# Patient Record
Sex: Female | Born: 1975 | Race: White | Hispanic: No | Marital: Married | Smoking: Former smoker
Health system: Southern US, Community
[De-identification: ages and names within clinical notes are randomized; demographics above are authoritative.]

## PROBLEM LIST (undated history)

## (undated) DIAGNOSIS — I499 Cardiac arrhythmia, unspecified: Secondary | ICD-10-CM

## (undated) DIAGNOSIS — M1990 Unspecified osteoarthritis, unspecified site: Secondary | ICD-10-CM

## (undated) DIAGNOSIS — I10 Essential (primary) hypertension: Secondary | ICD-10-CM

## (undated) DIAGNOSIS — J302 Other seasonal allergic rhinitis: Secondary | ICD-10-CM

## (undated) HISTORY — DX: Essential (primary) hypertension: I10

---

## 2005-09-14 ENCOUNTER — Observation Stay: Payer: Self-pay

## 2005-09-15 ENCOUNTER — Inpatient Hospital Stay: Payer: Self-pay

## 2010-01-18 HISTORY — PX: BREAST SURGERY: SHX581

## 2011-01-19 HISTORY — PX: AUGMENTATION MAMMAPLASTY: SUR837

## 2016-04-05 ENCOUNTER — Telehealth: Payer: Self-pay | Admitting: Obstetrics & Gynecology

## 2016-04-05 NOTE — Telephone Encounter (Signed)
[facility] I would send in the refill for you but I don't know how.

## 2016-04-05 NOTE — Telephone Encounter (Signed)
bc refilled enough until annual, faxed to pharmacy

## 2016-04-05 NOTE — Telephone Encounter (Signed)
Patient is a former [facility] pt.  She called and scheduled her annual for May 8th with [facility].  She needs a refill on her bc to last until then.  She stated that she should have started her pills yesterday.  Please advise pt. At [phone]

## 2016-05-10 ENCOUNTER — Ambulatory Visit (INDEPENDENT_AMBULATORY_CARE_PROVIDER_SITE_OTHER): Payer: BLUE CROSS/BLUE SHIELD | Admitting: Family Medicine

## 2016-05-10 ENCOUNTER — Encounter: Payer: Self-pay | Admitting: Family Medicine

## 2016-05-10 VITALS — BP 142/90 | HR 80 | Ht 63.0 in | Wt 158.0 lb

## 2016-05-10 DIAGNOSIS — R030 Elevated blood-pressure reading, without diagnosis of hypertension: Secondary | ICD-10-CM | POA: Diagnosis not present

## 2016-05-10 DIAGNOSIS — I493 Ventricular premature depolarization: Secondary | ICD-10-CM

## 2016-05-10 MED ORDER — HYDROCHLOROTHIAZIDE 12.5 MG PO TABS: 12.5000 mg | ORAL_TABLET | Freq: Every day | ORAL | 3 refills | Status: DC

## 2016-05-10 NOTE — Progress Notes (Signed)
Name: Joanna Huffman   MRN: [id number]    DOB: 1975-07-11   Date:05/10/2016       Progress Note  Subjective  Chief Complaint  Chief Complaint  Patient presents with  . Establish Care  . Hypertension    diastolic readings are elevated    Hypertension  This is a new problem. The current episode started more than 1 month ago. The problem has been waxing and waning since onset. The problem is uncontrolled. Pertinent negatives include no anxiety, blurred vision, chest pain, headaches, malaise/fatigue, neck pain, orthopnea, palpitations, peripheral edema, PND, shortness of breath or sweats. There are no associated agents to hypertension. There are no known risk factors for coronary artery disease. Past treatments include nothing. There are no compliance problems.  There is no history of angina, kidney disease, CAD/MI, CVA, heart failure, left ventricular hypertrophy, PVD or retinopathy. conduction problem. There is no history of chronic renal disease, coarctation of the aorta, a hypertension causing med, pheochromocytoma or renovascular disease.    No problem-specific Assessment & Plan notes found for this encounter.   History reviewed. No pertinent past medical history.  Past Surgical History:  Procedure Laterality Date  . BREAST SURGERY  2012   augmentation    Family History  Problem Relation Age of Onset  . Hypertension Mother   . Hypertension Father   . Diabetes Father   . Heart disease Paternal Grandmother     Social History   Social History  . Marital status: Married    Spouse name: N/A  . Number of children: N/A  . Years of education: N/A   Occupational History  . Not on file.   Social History Main Topics  . Smoking status: Former Smoker    Types: Cigarettes  . Smokeless tobacco: Never Used  . Alcohol use Yes  . Drug use: No  . Sexual activity: Yes   Other Topics Concern  . Not on file   Social History Narrative  . No narrative on file    No Known  Allergies  No outpatient prescriptions prior to visit.   No facility-administered medications prior to visit.     Review of Systems  Constitutional: Negative for chills, fever, malaise/fatigue and weight loss.  HENT: Negative for ear discharge, ear pain and sore throat.   Eyes: Negative for blurred vision.  Respiratory: Negative for cough, sputum production, shortness of breath and wheezing.   Cardiovascular: Negative for chest pain, palpitations, orthopnea, leg swelling and PND.  Gastrointestinal: Negative for abdominal pain, blood in stool, constipation, diarrhea, heartburn, melena and nausea.  Genitourinary: Negative for dysuria, frequency, hematuria and urgency.  Musculoskeletal: Negative for back pain, joint pain, myalgias and neck pain.  Skin: Negative for rash.  Neurological: Negative for dizziness, tingling, sensory change, focal weakness and headaches.  Endo/Heme/Allergies: Negative for environmental allergies and polydipsia. Does not bruise/bleed easily.  Psychiatric/Behavioral: Negative for depression and suicidal ideas. The patient is not nervous/anxious and does not have insomnia.      Objective  Vitals:   05/10/16 1432  BP: (!) 142/90  Pulse: 80  Weight: 158 lb (71.7 kg)  Height:  (1.6 m)    Physical Exam  Constitutional: She is well-developed, well-nourished, and in no distress. No distress.  HENT:  Head: Normocephalic and atraumatic.  Right Ear: External ear normal.  Left Ear: External ear normal.  Nose: Nose normal.  Mouth/Throat: Oropharynx is clear and moist.  Eyes: Conjunctivae and EOM are normal. Pupils are equal, round, and reactive  to light. Right eye exhibits no discharge. Left eye exhibits no discharge.  Neck: Normal range of motion. Neck supple. No JVD present. No thyromegaly present.  Cardiovascular: Normal rate, regular rhythm, normal heart sounds and intact distal pulses.  Exam reveals no gallop and no friction rub.   No murmur  heard. Pulmonary/Chest: Effort normal and breath sounds normal. She has no wheezes. She has no rales.  Abdominal: Soft. Bowel sounds are normal. She exhibits no mass. There is no tenderness. There is no guarding.  Musculoskeletal: Normal range of motion. She exhibits no edema.  Lymphadenopathy:    She has no cervical adenopathy.  Neurological: She is alert. She has normal reflexes.  Skin: Skin is warm and dry. She is not diaphoretic.  Psychiatric: Mood and affect normal.  Nursing note and vitals reviewed.     Assessment & Plan  Problem List Items Addressed This Visit      Cardiovascular and Mediastinum   Asymptomatic PVCs   Relevant Medications   hydrochlorothiazide (HYDRODIURIL) 12.5 MG tablet     Other   Blood pressure elevated without history of HTN - Primary   Relevant Medications   hydrochlorothiazide (HYDRODIURIL) 12.5 MG tablet      Meds ordered this encounter  Medications  . hydrochlorothiazide (HYDRODIURIL) 12.5 MG tablet    Sig: Take 1 tablet (12.5 mg total) by mouth daily.    Dispense:  90 tablet    Refill:  3      Dr. Elizabeth Sauer [facility]  05/10/16

## 2016-05-25 ENCOUNTER — Ambulatory Visit: Payer: Self-pay | Admitting: Obstetrics & Gynecology

## 2016-06-21 ENCOUNTER — Ambulatory Visit (INDEPENDENT_AMBULATORY_CARE_PROVIDER_SITE_OTHER): Payer: BLUE CROSS/BLUE SHIELD | Admitting: Family Medicine

## 2016-06-21 ENCOUNTER — Encounter: Payer: Self-pay | Admitting: Family Medicine

## 2016-06-21 VITALS — BP 134/86 | HR 62 | Ht 63.0 in | Wt 168.0 lb

## 2016-06-21 DIAGNOSIS — R030 Elevated blood-pressure reading, without diagnosis of hypertension: Secondary | ICD-10-CM

## 2016-06-21 MED ORDER — HYDROCHLOROTHIAZIDE 12.5 MG PO TABS: 12.5000 mg | ORAL_TABLET | Freq: Every day | ORAL | 2 refills | Status: DC

## 2016-06-21 NOTE — Progress Notes (Signed)
Name: Joanna Huffman   MRN: [id number]    DOB: 07-23-1975   Date:06/21/2016       Progress Note  Subjective  Chief Complaint  Chief Complaint  Patient presents with  . Hypertension    follow up - 6 weeks    Hypertension  This is a new problem. The current episode started more than 1 month ago. The problem has been gradually improving since onset. The problem is controlled. Pertinent negatives include no anxiety, blurred vision, chest pain, headaches, malaise/fatigue, neck pain, orthopnea, palpitations, peripheral edema, PND, shortness of breath or sweats. There are no associated agents to hypertension. Past treatments include diuretics. The current treatment provides moderate improvement. There are no compliance problems.  There is no history of angina, kidney disease, CAD/MI, CVA, heart failure, left ventricular hypertrophy, PVD or retinopathy. There is no history of chronic renal disease, a hypertension causing med or renovascular disease.    No problem-specific Assessment & Plan notes found for this encounter.   History reviewed. No pertinent past medical history.  Past Surgical History:  Procedure Laterality Date  . BREAST SURGERY  2012   augmentation    Family History  Problem Relation Age of Onset  . Hypertension Mother   . Hypertension Father   . Diabetes Father   . Heart disease Paternal Grandmother     Social History   Social History  . Marital status: Married    Spouse name: N/A  . Number of children: N/A  . Years of education: N/A   Occupational History  . Not on file.   Social History Main Topics  . Smoking status: Former Smoker    Types: Cigarettes  . Smokeless tobacco: Never Used  . Alcohol use Yes  . Drug use: No  . Sexual activity: Yes   Other Topics Concern  . Not on file   Social History Narrative  . No narrative on file    No Known Allergies  Outpatient Medications Prior to Visit  Medication Sig Dispense Refill  . levonorgestrel-ethinyl  estradiol (SEASONALE,INTROVALE,JOLESSA) 0.15-0.03 MG tablet Take 1 tablet by mouth daily. [facility]    . hydrochlorothiazide (HYDRODIURIL) 12.5 MG tablet Take 1 tablet (12.5 mg total) by mouth daily. 90 tablet 3   No facility-administered medications prior to visit.     Review of Systems  Constitutional: Negative for chills, fever, malaise/fatigue and weight loss.  HENT: Negative for ear discharge, ear pain and sore throat.   Eyes: Negative for blurred vision.  Respiratory: Negative for cough, sputum production, shortness of breath and wheezing.   Cardiovascular: Negative for chest pain, palpitations, orthopnea, leg swelling and PND.  Gastrointestinal: Negative for abdominal pain, blood in stool, constipation, diarrhea, heartburn, melena and nausea.  Genitourinary: Negative for dysuria, frequency, hematuria and urgency.  Musculoskeletal: Negative for back pain, joint pain, myalgias and neck pain.  Skin: Negative for rash.  Neurological: Negative for dizziness, tingling, sensory change, focal weakness and headaches.  Endo/Heme/Allergies: Negative for environmental allergies and polydipsia. Does not bruise/bleed easily.  Psychiatric/Behavioral: Negative for depression and suicidal ideas. The patient is not nervous/anxious and does not have insomnia.      Objective  Vitals:   06/21/16 1511  BP: 134/86  Pulse: 62  Weight: 168 lb (76.2 kg)  Height: 5\' 3"  (1.6 m)    Physical Exam  Constitutional: She is well-developed, well-nourished, and in no distress. No distress.  HENT:  Head: Normocephalic and atraumatic.  Right Ear: Tympanic membrane, external ear and ear canal normal.  Left Ear: Tympanic membrane, external ear and ear canal normal.  Nose: Nose normal.  Mouth/Throat: Oropharynx is clear and moist.  Eyes: Conjunctivae and EOM are normal. Pupils are equal, round, and reactive to light. Right eye exhibits no discharge. Left eye exhibits no discharge.  Neck: Normal range of  motion. Neck supple. No JVD present. No thyromegaly present.  Cardiovascular: Normal rate, regular rhythm, normal heart sounds and intact distal pulses.  Exam reveals no gallop and no friction rub.   No murmur heard. Pulmonary/Chest: Effort normal and breath sounds normal. She has no wheezes. She has no rales.  Abdominal: Soft. Bowel sounds are normal. She exhibits no mass. There is no tenderness. There is no guarding.  Musculoskeletal: Normal range of motion. She exhibits no edema.  Lymphadenopathy:    She has no cervical adenopathy.  Neurological: She is alert.  Skin: Skin is warm and dry. She is not diaphoretic.  Psychiatric: Mood and affect normal.  Nursing note and vitals reviewed.     Assessment & Plan  Problem List Items Addressed This Visit      Other   Blood pressure elevated without history of HTN - Primary   Relevant Medications   hydrochlorothiazide (HYDRODIURIL) 12.5 MG tablet   Other Relevant Orders   Renal Function Panel      Meds ordered this encounter  Medications  . hydrochlorothiazide (HYDRODIURIL) 12.5 MG tablet    Sig: Take 1 tablet (12.5 mg total) by mouth daily.    Dispense:  90 tablet    Refill:  2      Dr. Elizabeth Sauereanna [facility]akenzee Choudhry [facility]  06/21/16

## 2016-06-22 LAB — RENAL FUNCTION PANEL
Albumin: 4.3 g/dL (ref 3.5–5.5)
BUN / CREAT RATIO: 16 (ref 9–23)
BUN: 15 mg/dL (ref 6–24)
CO2: 26 mmol/L (ref 18–29)
CREATININE: 0.92 mg/dL (ref 0.57–1.00)
Calcium: 9.7 mg/dL (ref 8.7–10.2)
Chloride: 99 mmol/L (ref 96–106)
GFR calc Af Amer: 88 mL/min/{1.73_m2} (ref >59)
GFR calc non Af Amer: 77 mL/min/{1.73_m2} (ref >59)
Glucose: 76 mg/dL (ref 65–99)
Phosphorus: 3.6 mg/dL (ref 2.5–4.5)
Potassium: 3.9 mmol/L (ref 3.5–5.2)
SODIUM: 138 mmol/L (ref 134–144)

## 2016-06-29 ENCOUNTER — Encounter: Payer: Self-pay | Admitting: Obstetrics & Gynecology

## 2016-06-29 ENCOUNTER — Ambulatory Visit (INDEPENDENT_AMBULATORY_CARE_PROVIDER_SITE_OTHER): Payer: BLUE CROSS/BLUE SHIELD | Admitting: Obstetrics & Gynecology

## 2016-06-29 VITALS — BP 138/90 | HR 70 | Ht 64.0 in | Wt 159.0 lb

## 2016-06-29 DIAGNOSIS — Z1231 Encounter for screening mammogram for malignant neoplasm of breast: Secondary | ICD-10-CM | POA: Diagnosis not present

## 2016-06-29 DIAGNOSIS — Z1329 Encounter for screening for other suspected endocrine disorder: Secondary | ICD-10-CM | POA: Diagnosis not present

## 2016-06-29 DIAGNOSIS — Z0000 Encounter for general adult medical examination without abnormal findings: Secondary | ICD-10-CM

## 2016-06-29 DIAGNOSIS — Z01419 Encounter for gynecological examination (general) (routine) without abnormal findings: Secondary | ICD-10-CM | POA: Diagnosis not present

## 2016-06-29 DIAGNOSIS — Z1321 Encounter for screening for nutritional disorder: Secondary | ICD-10-CM

## 2016-06-29 DIAGNOSIS — Z13220 Encounter for screening for lipoid disorders: Secondary | ICD-10-CM | POA: Diagnosis not present

## 2016-06-29 DIAGNOSIS — Z1239 Encounter for other screening for malignant neoplasm of breast: Secondary | ICD-10-CM | POA: Insufficient documentation

## 2016-06-29 DIAGNOSIS — Z131 Encounter for screening for diabetes mellitus: Secondary | ICD-10-CM

## 2016-06-29 MED ORDER — LEVONORGEST-ETH ESTRAD 91-DAY 0.15-0.03 MG PO TABS: 1.0000 | ORAL_TABLET | Freq: Every day | ORAL | 4 refills | Status: DC

## 2016-06-29 NOTE — Patient Instructions (Signed)
PAP every three years Mammogram every year Labs soon  [place] [phone]

## 2016-06-29 NOTE — Progress Notes (Signed)
HPI:      [name] is a 41 y.o. [phone] who LMP was Patient's last menstrual period was 03/29/2016., she presents today for her annual examination. The patient has no complaints today. The patient is sexually active. Her last pap: approximate date 2017 and was normal and last mammogram: approximate date 2017 and was normal. The patient does perform self breast exams.  There is notable family history of breast or ovarian cancer in her family.  The patient has regular exercise: yes.  The patient denies current symptoms of depression.    GYN History: Contraception: OCP (estrogen/progesterone)  PMHx: Past Medical History:  Diagnosis Date  . Hypertension    Past Surgical History:  Procedure Laterality Date  . BREAST SURGERY  2012   augmentation   Family History  Problem Relation Age of Onset  . Hypertension Mother   . Hypertension Father   . Diabetes Father   . Heart disease Paternal Grandmother    Social History  Substance Use Topics  . Smoking status: Former Smoker    Types: Cigarettes  . Smokeless tobacco: Never Used  . Alcohol use Yes    Current Outpatient Prescriptions:  .  hydrochlorothiazide (HYDRODIURIL) 12.5 MG tablet, Take 1 tablet (12.5 mg total) by mouth daily., Disp: 90 tablet, Rfl: 2 .  levonorgestrel-ethinyl estradiol (SEASONALE,INTROVALE,JOLESSA) 0.15-0.03 MG tablet, Take 1 tablet by mouth daily. [facility], Disp: 1 Package, Rfl: 4 Allergies: Patient has no known allergies.  Review of Systems  Constitutional: Negative for chills, fever and malaise/fatigue.  HENT: Negative for congestion, sinus pain and sore throat.   Eyes: Negative for blurred vision and pain.  Respiratory: Negative for cough and wheezing.   Cardiovascular: Negative for chest pain and leg swelling.  Gastrointestinal: Negative for abdominal pain, constipation, diarrhea, heartburn, nausea and vomiting.  Genitourinary: Negative for dysuria, frequency, hematuria and urgency.    Musculoskeletal: Negative for back pain, joint pain, myalgias and neck pain.  Skin: Negative for itching and rash.  Neurological: Negative for dizziness, tremors and weakness.  Endo/Heme/Allergies: Does not bruise/bleed easily.  Psychiatric/Behavioral: Negative for depression. The patient is not nervous/anxious and does not have insomnia.     Objective: BP 138/90   Pulse 70   Ht 5' 4"  (1.626 m)   Wt 159 lb (72.1 kg)   LMP 03/29/2016   BMI 27.29 kg/m   Filed Weights   06/29/16 0810  Weight: 159 lb (72.1 kg)   Body mass index is 27.29 kg/m. Physical Exam  Constitutional: She is oriented to person, place, and time. She appears well-developed and well-nourished. No distress.  Genitourinary: Rectum normal, vagina normal and uterus normal. Pelvic exam was performed with patient supine. There is no rash or lesion on the right labia. There is no rash or lesion on the left labia. Vagina exhibits no lesion. No bleeding in the vagina. Right adnexum does not display mass and does not display tenderness. Left adnexum does not display mass and does not display tenderness. Cervix does not exhibit motion tenderness, lesion, friability or polyp.   Uterus is mobile and midaxial. Uterus is not enlarged or exhibiting a mass.  HENT:  Head: Normocephalic and atraumatic. Head is without laceration.  Right Ear: Hearing normal.  Left Ear: Hearing normal.  Nose: No epistaxis.  No foreign bodies.  Mouth/Throat: Uvula is midline, oropharynx is clear and moist and mucous membranes are normal.  Eyes: Pupils are equal, round, and reactive to light.  Neck: Normal range of motion. Neck supple.  No thyromegaly present.  Cardiovascular: Normal rate and regular rhythm.  Exam reveals no gallop and no friction rub.   No murmur heard. Pulmonary/Chest: Effort normal and breath sounds normal. No respiratory distress. She has no wheezes. Right breast exhibits no mass, no skin change and no tenderness. Left breast exhibits  no mass, no skin change and no tenderness.  Abdominal: Soft. Bowel sounds are normal. She exhibits no distension. There is no tenderness. There is no rebound.  Musculoskeletal: Normal range of motion.  Neurological: She is alert and oriented to person, place, and time. No cranial nerve deficit.  Skin: Skin is warm and dry.  Psychiatric: She has a normal mood and affect. Judgment normal.  Vitals reviewed.   Assessment:  ANNUAL EXAM 1. Annual physical exam   2. Screening for breast cancer   3. Screening for thyroid disorder   4. Screening for cholesterol level   5. Encounter for vitamin deficiency screening   6. Screening for diabetes mellitus    Screening Plan:            1.  Cervical Screening-  Pap smear schedule reviewed with patient, due 2020  2. Breast screening- Exam annually and mammogram>40 planned   3. Labs Ordered today  4. Counseling for contraception: oral contraceptives (estrogen/progesterone) Counseled as to HTN monitoring and risks of OCPs and HTN.  May need to change if poorly controlled.  Has tolerated well for years and would like to continue this regimen (period once every three years).  Menopause discussed.   5.  She presents with a significant personal and/or family history of breast cancer maternal aunt 1 onset age 31. Details of which can be found in her medical/family history. She does not have a previously identified BRCA and Lynch syndrome mutation in her family. Due to her personal and/or family history of cancer she is a candidate for the [facility] test(s).  Risk for cancer, genetic susceptibility discussed.  Patient has declined gene testing.  Discussed BRCA as well as Lynch syndrome and other cancer risk assessments available based on her family history and personal history. Pros and cons of testing discussed.    Other:  1. Annual physical exam  2. Screening for breast cancer - MM DIGITAL SCREENING BILATERAL; Future  3. Screening for thyroid  disorder - TSH; Future  4. Screening for cholesterol level - Lipid panel; Future  5. Encounter for vitamin deficiency screening - Vitamin D 1,25 dihydroxy; Future  6. Screening for diabetes mellitus - Glucose, fasting; Future    F/U  Return in about 1 year (around 06/29/2017) for Annual.  Barnett Applebaum, MD, [facility], [facility] 06/29/2016  8:28 AM

## 2016-07-06 ENCOUNTER — Other Ambulatory Visit: Payer: Self-pay | Admitting: Obstetrics & Gynecology

## 2016-07-06 ENCOUNTER — Ambulatory Visit
Admission: RE | Admit: 2016-07-06 | Discharge: 2016-07-06 | Disposition: A | Discharge status: Home / Self Care | Payer: BLUE CROSS/BLUE SHIELD | Source: Ambulatory Visit | Attending: Obstetrics & Gynecology | Admitting: Obstetrics & Gynecology

## 2016-07-06 ENCOUNTER — Other Ambulatory Visit: Payer: Self-pay

## 2016-07-06 DIAGNOSIS — Z131 Encounter for screening for diabetes mellitus: Secondary | ICD-10-CM

## 2016-07-06 DIAGNOSIS — Z1231 Encounter for screening mammogram for malignant neoplasm of breast: Secondary | ICD-10-CM | POA: Diagnosis present

## 2016-07-06 DIAGNOSIS — Z1329 Encounter for screening for other suspected endocrine disorder: Secondary | ICD-10-CM

## 2016-07-06 DIAGNOSIS — Z13220 Encounter for screening for lipoid disorders: Secondary | ICD-10-CM

## 2016-07-06 DIAGNOSIS — Z1239 Encounter for other screening for malignant neoplasm of breast: Secondary | ICD-10-CM

## 2016-07-06 DIAGNOSIS — Z1321 Encounter for screening for nutritional disorder: Secondary | ICD-10-CM

## 2016-07-13 ENCOUNTER — Other Ambulatory Visit: Payer: Self-pay | Admitting: *Deleted

## 2016-07-13 ENCOUNTER — Inpatient Hospital Stay
Admission: RE | Admit: 2016-07-13 | Discharge: 2016-07-13 | Disposition: A | Discharge status: Home / Self Care | Payer: Self-pay | Source: Ambulatory Visit | Attending: *Deleted | Admitting: *Deleted

## 2016-07-13 DIAGNOSIS — Z9289 Personal history of other medical treatment: Secondary | ICD-10-CM

## 2016-09-27 ENCOUNTER — Other Ambulatory Visit: Payer: Self-pay | Admitting: Obstetrics & Gynecology

## 2016-09-27 ENCOUNTER — Telehealth: Payer: Self-pay

## 2016-09-27 MED ORDER — LEVONORGEST-ETH EST & ETH EST 42-21-21-7 DAYS PO TABS: 1.0000 | ORAL_TABLET | Freq: Every day | ORAL | 3 refills | Status: DC

## 2016-09-27 NOTE — Telephone Encounter (Signed)
Rx changed to a pill that continues to allow one period every 3 months ([place]). Let her know.

## 2016-09-27 NOTE — Telephone Encounter (Signed)
Pt states pharmacy informed her that her OCP has been recalled and her Dr would need to send in new RX. RPH pt.

## 2016-09-27 NOTE — Telephone Encounter (Signed)
Pt aware.

## 2016-09-27 NOTE — Telephone Encounter (Signed)
Called pt mailbox full

## 2017-07-06 ENCOUNTER — Other Ambulatory Visit: Payer: Self-pay | Admitting: Family Medicine

## 2017-07-06 DIAGNOSIS — R030 Elevated blood-pressure reading, without diagnosis of hypertension: Secondary | ICD-10-CM

## 2017-07-11 ENCOUNTER — Ambulatory Visit: Payer: BLUE CROSS/BLUE SHIELD | Admitting: Family Medicine

## 2017-07-14 ENCOUNTER — Ambulatory Visit: Payer: BLUE CROSS/BLUE SHIELD | Admitting: Family Medicine

## 2017-08-01 ENCOUNTER — Ambulatory Visit (INDEPENDENT_AMBULATORY_CARE_PROVIDER_SITE_OTHER): Payer: BLUE CROSS/BLUE SHIELD | Admitting: Family Medicine

## 2017-08-01 ENCOUNTER — Encounter: Payer: Self-pay | Admitting: Family Medicine

## 2017-08-01 VITALS — BP 130/86 | HR 68 | Ht 64.0 in | Wt 162.0 lb

## 2017-08-01 DIAGNOSIS — I10 Essential (primary) hypertension: Secondary | ICD-10-CM | POA: Diagnosis not present

## 2017-08-01 MED ORDER — LISINOPRIL-HYDROCHLOROTHIAZIDE 10-12.5 MG PO TABS: 1.0000 | ORAL_TABLET | Freq: Every day | ORAL | 2 refills | Status: DC

## 2017-08-01 NOTE — Progress Notes (Signed)
Name: Joanna Huffman   MRN: [id number]    DOB: Nov 27, 1975   Date:08/01/2017       Progress Note  Subjective  Chief Complaint  Chief Complaint  Patient presents with  . Hypertension    refill b/p med- b/p was elevated when pharmacist checked it    Hypertension  This is a chronic problem. The current episode started more than 1 year ago. The problem has been gradually improving since onset. The problem is uncontrolled. Pertinent negatives include no anxiety, blurred vision, chest pain, headaches, malaise/fatigue, neck pain, orthopnea, palpitations, peripheral edema, PND, shortness of breath or sweats. There are no associated agents to hypertension. There are no known risk factors for coronary artery disease. Past treatments include diuretics. The current treatment provides mild improvement. There are no compliance problems.  There is no history of angina, kidney disease, CAD/MI, CVA, heart failure, left ventricular hypertrophy, PVD or retinopathy. There is no history of chronic renal disease, a hypertension causing med or renovascular disease.    No problem-specific Assessment & Plan notes found for this encounter.   Past Medical History:  Diagnosis Date  . Hypertension     Past Surgical History:  Procedure Laterality Date  . AUGMENTATION MAMMAPLASTY Bilateral 2013  . BREAST SURGERY  2012   augmentation    Family History  Problem Relation Age of Onset  . Hypertension Mother   . Hypertension Father   . Diabetes Father   . Heart disease Paternal Grandmother   . Breast cancer Maternal Aunt   . Breast cancer Paternal Aunt     Social History   Socioeconomic History  . Marital status: Married    Spouse name: Not on file  . Number of children: Not on file  . Years of education: Not on file  . Highest education level: Not on file  Occupational History  . Not on file  Social Needs  . Financial resource strain: Not on file  . Food insecurity:    Worry: Not on file   Inability: Not on file  . Transportation needs:    Medical: Not on file    Non-medical: Not on file  Tobacco Use  . Smoking status: Former Smoker    Types: Cigarettes  . Smokeless tobacco: Never Used  Substance and Sexual Activity  . Alcohol use: Yes  . Drug use: No  . Sexual activity: Yes    Birth control/protection: Pill  Lifestyle  . Physical activity:    Days per week: Not on file    Minutes per session: Not on file  . Stress: Not on file  Relationships  . Social connections:    Talks on phone: Not on file    Gets together: Not on file    Attends religious service: Not on file    Active member of club or organization: Not on file    Attends meetings of clubs or organizations: Not on file    Relationship status: Not on file  . Intimate partner violence:    Fear of current or ex partner: Not on file    Emotionally abused: Not on file    Physically abused: Not on file    Forced sexual activity: Not on file  Other Topics Concern  . Not on file  Social History Narrative  . Not on file    No Known Allergies  Outpatient Medications Prior to Visit  Medication Sig Dispense Refill  . Levonorgest-Eth Est & Eth Est (QUARTETTE) 42-21-21-7 DAYS TABS Take 1 tablet  by mouth daily. 91 tablet 3  . hydrochlorothiazide (HYDRODIURIL) 12.5 MG tablet TAKE 1 TABLET BY MOUTH DAILY 30 tablet 0   No facility-administered medications prior to visit.     Review of Systems  Constitutional: Negative for chills, fever, malaise/fatigue and weight loss.  HENT: Negative for ear discharge, ear pain and sore throat.   Eyes: Negative for blurred vision.  Respiratory: Negative for cough, sputum production, shortness of breath and wheezing.   Cardiovascular: Negative for chest pain, palpitations, orthopnea, leg swelling and PND.  Gastrointestinal: Negative for abdominal pain, blood in stool, constipation, diarrhea, heartburn, melena and nausea.  Genitourinary: Negative for dysuria, frequency,  hematuria and urgency.  Musculoskeletal: Negative for back pain, joint pain, myalgias and neck pain.  Skin: Negative for rash.  Neurological: Negative for dizziness, tingling, sensory change, focal weakness and headaches.  Endo/Heme/Allergies: Negative for environmental allergies and polydipsia. Does not bruise/bleed easily.  Psychiatric/Behavioral: Negative for depression and suicidal ideas. The patient is not nervous/anxious and does not have insomnia.      Objective  Vitals:   08/01/17 1604  BP: 130/86  Pulse: 68  Weight: 162 lb (73.5 kg)  Height: 5\' 4"  (1.626 m)    Physical Exam  Constitutional: She is oriented to person, place, and time. She appears well-developed and well-nourished.  HENT:  Head: Normocephalic.  Right Ear: External ear normal.  Left Ear: External ear normal.  Mouth/Throat: Oropharynx is clear and moist.  Eyes: Pupils are equal, round, and reactive to light. Conjunctivae and EOM are normal. Lids are everted and swept, no foreign bodies found. Left eye exhibits no hordeolum. No foreign body present in the left eye. Right conjunctiva is not injected. Left conjunctiva is not injected. No scleral icterus.  Neck: Normal range of motion. Neck supple. No JVD present. No tracheal deviation present. No thyromegaly present.  Cardiovascular: Normal rate, regular rhythm, normal heart sounds and intact distal pulses. Exam reveals no gallop and no friction rub.  No murmur heard. Pulmonary/Chest: Effort normal and breath sounds normal. No respiratory distress. She has no wheezes. She has no rales.  Abdominal: Soft. Bowel sounds are normal. She exhibits no mass. There is no hepatosplenomegaly. There is no tenderness. There is no rebound and no guarding.  Musculoskeletal: Normal range of motion. She exhibits no edema or tenderness.  Lymphadenopathy:    She has no cervical adenopathy.  Neurological: She is alert and oriented to person, place, and time. She has normal strength.  She displays normal reflexes. No cranial nerve deficit.  Skin: Skin is warm. No rash noted.  Psychiatric: She has a normal mood and affect. Her mood appears not anxious. She does not exhibit a depressed mood.  Nursing note and vitals reviewed.     Assessment & Plan  Problem List Items Addressed This Visit    None    Visit Diagnoses    Essential hypertension    -  Primary   changed to lisinopril/hctz- recheck in 4-6 weeks/ advised to limit salt intake   Relevant Medications   lisinopril-hydrochlorothiazide (PRINZIDE,ZESTORETIC) 10-12.5 MG tablet      Meds ordered this encounter  Medications  . lisinopril-hydrochlorothiazide (PRINZIDE,ZESTORETIC) 10-12.5 MG tablet    Sig: Take 1 tablet by mouth daily.    Dispense:  30 tablet    Refill:  2      Dr. Hayden Rasmusseneanna Jones [facility]  08/01/17

## 2017-08-03 ENCOUNTER — Other Ambulatory Visit: Payer: Self-pay | Admitting: Family Medicine

## 2017-08-03 DIAGNOSIS — R030 Elevated blood-pressure reading, without diagnosis of hypertension: Secondary | ICD-10-CM

## 2017-08-26 ENCOUNTER — Encounter: Payer: Self-pay | Admitting: Family Medicine

## 2017-08-26 ENCOUNTER — Ambulatory Visit (INDEPENDENT_AMBULATORY_CARE_PROVIDER_SITE_OTHER): Payer: BLUE CROSS/BLUE SHIELD | Admitting: Family Medicine

## 2017-08-26 VITALS — BP 120/70 | HR 64 | Ht 64.0 in | Wt 162.0 lb

## 2017-08-26 DIAGNOSIS — F320 Major depressive disorder, single episode, mild: Secondary | ICD-10-CM | POA: Diagnosis not present

## 2017-08-26 DIAGNOSIS — R030 Elevated blood-pressure reading, without diagnosis of hypertension: Secondary | ICD-10-CM | POA: Diagnosis not present

## 2017-08-26 MED ORDER — SERTRALINE HCL 50 MG PO TABS: 50.0000 mg | ORAL_TABLET | Freq: Every day | ORAL | 3 refills | Status: DC

## 2017-08-26 NOTE — Assessment & Plan Note (Signed)
Recheck. currently on lisinopril 10/12,5. Controlled on present dose.

## 2017-08-26 NOTE — Progress Notes (Signed)
Name: Joanna Huffman   MRN: [id number]    DOB: 1975-07-06   Date:08/26/2017       Progress Note  Subjective  Chief Complaint  Chief Complaint  Patient presents with  . Depression    has noticed a change since winter in herself. Has been on antidepressants in the past, but has been doing fine until last year. Took paxil in the past- did good on that. Concerned about weight gain that may occur with antidepressants.    Depression         This is a recurrent problem.  The current episode started more than 1 month ago (several months).   The onset quality is gradual.   The problem occurs daily.  The problem has been gradually worsening since onset.  Associated symptoms include decreased concentration, fatigue, helplessness, hopelessness, irritable, decreased interest, appetite change and sad.  Associated symptoms include does not have insomnia, no restlessness, no body aches, no myalgias, no headaches, no indigestion and no suicidal ideas.( Decreased libido/anxiety)     The symptoms are aggravated by nothing.  Past treatments include SSRIs - Selective serotonin reuptake inhibitors (paxil /late twenties /divorce).  Compliance with treatment is good.  Previous treatment provided mild relief.   Blood pressure elevated without history of HTN Recheck. currently on lisinopril 10/12,5. Controlled on present dose.   Past Medical History:  Diagnosis Date  . Hypertension     Past Surgical History:  Procedure Laterality Date  . AUGMENTATION MAMMAPLASTY Bilateral 2013  . BREAST SURGERY  2012   augmentation    Family History  Problem Relation Age of Onset  . Hypertension Mother   . Hypertension Father   . Diabetes Father   . Heart disease Paternal Grandmother   . Breast cancer Maternal Aunt   . Breast cancer Paternal Aunt     Social History   Socioeconomic History  . Marital status: Married    Spouse name: Not on file  . Number of children: Not on file  . Years of education: Not on file  .  Highest education level: Not on file  Occupational History  . Not on file  Social Needs  . Financial resource strain: Not on file  . Food insecurity:    Worry: Not on file    Inability: Not on file  . Transportation needs:    Medical: Not on file    Non-medical: Not on file  Tobacco Use  . Smoking status: Former Smoker    Types: Cigarettes  . Smokeless tobacco: Never Used  Substance and Sexual Activity  . Alcohol use: Yes  . Drug use: No  . Sexual activity: Yes    Birth control/protection: Pill  Lifestyle  . Physical activity:    Days per week: Not on file    Minutes per session: Not on file  . Stress: Not on file  Relationships  . Social connections:    Talks on phone: Not on file    Gets together: Not on file    Attends religious service: Not on file    Active member of club or organization: Not on file    Attends meetings of clubs or organizations: Not on file    Relationship status: Not on file  . Intimate partner violence:    Fear of current or ex partner: Not on file    Emotionally abused: Not on file    Physically abused: Not on file    Forced sexual activity: Not on file  Other Topics  Concern  . Not on file  Social History Narrative  . Not on file    No Known Allergies  Outpatient Medications Prior to Visit  Medication Sig Dispense Refill  . Levonorgest-Eth Est & Eth Est 42-21-21-7 DAYS TABS Take by mouth.    Marland Kitchen. lisinopril-hydrochlorothiazide (PRINZIDE,ZESTORETIC) 10-12.5 MG tablet Take 1 tablet by mouth daily. 30 tablet 2  . Levonorgest-Eth Est & Eth Est (QUARTETTE) 42-21-21-7 DAYS TABS Take 1 tablet by mouth daily. 91 tablet 3   No facility-administered medications prior to visit.     Review of Systems  Constitutional: Positive for appetite change and fatigue. Negative for chills, fever, malaise/fatigue and weight loss.  HENT: Negative for ear discharge, ear pain and sore throat.   Eyes: Negative for blurred vision.  Respiratory: Negative for cough,  sputum production, shortness of breath and wheezing.   Cardiovascular: Negative for chest pain, palpitations and leg swelling.  Gastrointestinal: Negative for abdominal pain, blood in stool, constipation, diarrhea, heartburn, melena and nausea.  Genitourinary: Negative for dysuria, frequency, hematuria and urgency.  Musculoskeletal: Negative for back pain, joint pain, myalgias and neck pain.  Skin: Negative for rash.  Neurological: Negative for dizziness, tingling, sensory change, focal weakness and headaches.  Endo/Heme/Allergies: Negative for environmental allergies and polydipsia. Does not bruise/bleed easily.  Psychiatric/Behavioral: Positive for decreased concentration and depression. Negative for suicidal ideas. The patient is not nervous/anxious and does not have insomnia.      Objective  Vitals:   08/26/17 0803  BP: 120/70  Pulse: 64  Weight: 162 lb (73.5 kg)  Height: 5\' 4"  (1.626 m)    Physical Exam  Constitutional: She is irritable. No distress.  HENT:  Head: Normocephalic and atraumatic.  Right Ear: External ear normal.  Left Ear: External ear normal.  Nose: Nose normal.  Mouth/Throat: Oropharynx is clear and moist.  Eyes: Pupils are equal, round, and reactive to light. Conjunctivae and EOM are normal. Right eye exhibits no discharge. Left eye exhibits no discharge.  Neck: Normal range of motion. Neck supple. No JVD present. No thyromegaly present.  Cardiovascular: Normal rate, regular rhythm, normal heart sounds and intact distal pulses. Exam reveals no gallop and no friction rub.  No murmur heard. Pulmonary/Chest: Effort normal and breath sounds normal.  Abdominal: Soft. Bowel sounds are normal. She exhibits no mass. There is no tenderness. There is no guarding.  Musculoskeletal: Normal range of motion. She exhibits no edema.  Lymphadenopathy:    She has no cervical adenopathy.  Neurological: She is alert. She has normal reflexes.  Skin: Skin is warm and dry. She  is not diaphoretic.  Nursing note and vitals reviewed.     Assessment & Plan  Problem List Items Addressed This Visit      Other   Blood pressure elevated without history of HTN    Recheck. currently on lisinopril 10/12,5. Controlled on present dose.       Other Visit Diagnoses    Current mild episode of major depressive disorder, unspecified whether recurrent (HCC)    -  Primary   Acute recurrent. PHQ-17. Will initiate sertraline 50mg  taper over 10 days. recheck in 4 weeks   Relevant Medications   sertraline (ZOLOFT) 50 MG tablet      Meds ordered this encounter  Medications  . sertraline (ZOLOFT) 50 MG tablet    Sig: Take 1 tablet (50 mg total) by mouth daily. Taper one half a day for 10 days.    Dispense:  30 tablet    Refill:  3      Dr. Elizabeth Sauer [facility]  08/26/17

## 2017-09-06 ENCOUNTER — Other Ambulatory Visit (HOSPITAL_COMMUNITY)
Admission: RE | Admit: 2017-09-06 | Discharge: 2017-09-06 | Disposition: A | Discharge status: Home / Self Care | Payer: BLUE CROSS/BLUE SHIELD | Source: Ambulatory Visit | Attending: Obstetrics and Gynecology | Admitting: Obstetrics and Gynecology

## 2017-09-06 ENCOUNTER — Encounter: Payer: Self-pay | Admitting: Obstetrics and Gynecology

## 2017-09-06 ENCOUNTER — Ambulatory Visit (INDEPENDENT_AMBULATORY_CARE_PROVIDER_SITE_OTHER): Payer: BLUE CROSS/BLUE SHIELD | Admitting: Obstetrics and Gynecology

## 2017-09-06 VITALS — BP 110/68 | HR 78 | Ht 64.0 in | Wt 162.0 lb

## 2017-09-06 DIAGNOSIS — Z0000 Encounter for general adult medical examination without abnormal findings: Secondary | ICD-10-CM

## 2017-09-06 DIAGNOSIS — Z1239 Encounter for other screening for malignant neoplasm of breast: Secondary | ICD-10-CM

## 2017-09-06 DIAGNOSIS — Z01419 Encounter for gynecological examination (general) (routine) without abnormal findings: Secondary | ICD-10-CM

## 2017-09-06 DIAGNOSIS — Z1231 Encounter for screening mammogram for malignant neoplasm of breast: Secondary | ICD-10-CM

## 2017-09-06 DIAGNOSIS — Z124 Encounter for screening for malignant neoplasm of cervix: Secondary | ICD-10-CM | POA: Diagnosis not present

## 2017-09-06 MED ORDER — LEVONORGEST-ETH EST & ETH EST 42-21-21-7 DAYS PO TABS: 1.0000 | ORAL_TABLET | Freq: Every day | ORAL | 3 refills | Status: DC

## 2017-09-06 NOTE — Progress Notes (Signed)
Gynecology Annual Exam  PCP: Duanne LimerickJones, Deanna C, MD  Chief Complaint:  Chief Complaint  Patient presents with  . Gynecologic Exam    History of Present Illness: Patient is a 42 y.o. G3P3003 presents for annual exam. The patient has no complaints today.   LMP: Patient's last menstrual period was 06/24/2017 (approximate). Average Interval: monthly Duration of flow: 5 days Heavy Menses: no Clots: no Intermenstrual Bleeding: no Postcoital Bleeding: no Dysmenorrhea: no   The patient is sexually active. She currently uses OCP (estrogen/progesterone) for contraception. She denies dyspareunia.    Review of Systems: ROS  Past Medical History:  Past Medical History:  Diagnosis Date  . Hypertension     Past Surgical History:  Past Surgical History:  Procedure Laterality Date  . AUGMENTATION MAMMAPLASTY Bilateral 2013  . BREAST SURGERY  2012   augmentation    Gynecologic History:  Patient's last menstrual period was 06/24/2017 (approximate). Contraception: OCP (estrogen/progesterone) Obstetric History: [id number]  Family History:  Family History  Problem Relation Age of Onset  . Hypertension Mother   . Hypertension Father   . Diabetes Father   . Heart disease Paternal Grandmother   . Breast cancer Maternal Aunt   . Breast cancer Paternal Aunt     Social History:  Social History   Socioeconomic History  . Marital status: Married    Spouse name: Not on file  . Number of children: Not on file  . Years of education: Not on file  . Highest education level: Not on file  Occupational History  . Not on file  Social Needs  . Financial resource strain: Not on file  . Food insecurity:    Worry: Not on file    Inability: Not on file  . Transportation needs:    Medical: Not on file    Non-medical: Not on file  Tobacco Use  . Smoking status: Former Smoker    Types: Cigarettes  . Smokeless tobacco: Never Used  Substance and Sexual Activity  . Alcohol use: Yes  .  Drug use: No  . Sexual activity: Yes    Birth control/protection: Pill  Lifestyle  . Physical activity:    Days per week: Not on file    Minutes per session: Not on file  . Stress: Not on file  Relationships  . Social connections:    Talks on phone: Not on file    Gets together: Not on file    Attends religious service: Not on file    Active member of club or organization: Not on file    Attends meetings of clubs or organizations: Not on file    Relationship status: Not on file  . Intimate partner violence:    Fear of current or ex partner: Not on file    Emotionally abused: Not on file    Physically abused: Not on file    Forced sexual activity: Not on file  Other Topics Concern  . Not on file  Social History Narrative  . Not on file    Allergies:  No Known Allergies  Medications: Prior to Admission medications   Medication Sig Start Date End Date Taking? Authorizing Provider  Levonorgest-Eth Est & Eth Est 42-21-21-7 DAYS TABS Take by mouth. 09/27/16  Yes [provider]  lisinopril-hydrochlorothiazide (PRINZIDE,ZESTORETIC) 10-12.5 MG tablet Take 1 tablet by mouth daily. 08/01/17  Yes Duanne LimerickJones, Deanna C, MD  sertraline (ZOLOFT) 50 MG tablet Take 1 tablet (50 mg total) by mouth daily. Taper one half a  day for 10 days. 08/26/17  Yes Duanne LimerickJones, Deanna C, MD  Levonorgest-Eth Est & Eth Est Nilda Calamity(QUARTETTE) 42-21-21-7 DAYS TABS Take 1 tablet by mouth daily. 09/06/17 12/06/17  Natale MilchSchuman, Christanna R, MD    Physical Exam Vitals: Blood pressure 110/68, pulse 78, height 5\' 4"  (1.626 m), weight 162 lb (73.5 kg), last menstrual period 06/24/2017.  General: NAD HEENT: normocephalic, anicteric Thyroid: no enlargement, no palpable nodules Pulmonary: No increased work of breathing, CTAB Cardiovascular: RRR, distal pulses 2+ Breast: Breast symmetrical, no tenderness, no palpable nodules or masses, no skin or nipple retraction present, no nipple discharge.  No axillary or supraclavicular  lymphadenopathy. Abdomen: NABS, soft, non-tender, non-distended.  Umbilicus without lesions.  No hepatomegaly, splenomegaly or masses palpable. No evidence of hernia  Genitourinary:  External: Normal external female genitalia.  Normal urethral meatus, normal Bartholin's and Skene's glands.    Vagina: Normal vaginal mucosa, no evidence of prolapse.    Cervix: Grossly normal in appearance, no bleeding  Uterus: Non-enlarged, mobile, normal contour.  No CMT  Adnexa: ovaries non-enlarged, no adnexal masses  Rectal: deferred  Lymphatic: no evidence of inguinal lymphadenopathy Extremities: no edema, erythema, or tenderness Neurologic: Grossly intact Psychiatric: mood appropriate, affect full  Female chaperone present for pelvic and breast  portions of the physical exam    Assessment: 42 y.o. G3P3003 routine annual exam  Plan: Problem List Items Addressed This Visit    None    Visit Diagnoses    Health care maintenance    -  Primary   Relevant Medications   Levonorgest-Eth Est & Eth Est (QUARTETTE) 42-21-21-7 DAYS TABS   Other Relevant Orders   Cytology - PAP   MM DIGITAL SCREENING BILATERAL   Encounter for cervical Pap smear with pelvic exam       Relevant Orders   Cytology - PAP   Screening breast examination       Relevant Orders   MM DIGITAL SCREENING BILATERAL   Breast cancer screening by mammogram       Relevant Orders   MM DIGITAL SCREENING BILATERAL      1) Mammogram - recommend yearly screening mammogram.  Mammogram Was ordered today   2) STI screening  wasoffered and declined  3) ASCCP guidelines and rational discussed.  Patient opts for every 5 years screening interval  4) Contraception - the patient is currently using  OCP (estrogen/progesterone).  She is happy with her current form of contraception and plans to continue  5) Routine healthcare maintenance including cholesterol, diabetes screening discussed managed by PCP  7) Return in about 1 year (around  09/07/2018) for annual, help change mychart password.  Discussed risk of CHC, patient would like to continue at this time.  Discussed genetic screening and provided patient with information. She will schedule if she decides she woulld like this.   Adelene Idlerhristanna Schuman MD [facility], [facility] 09/06/17 9:23 AM

## 2017-09-09 LAB — CYTOLOGY - PAP
DIAGNOSIS: NEGATIVE
HPV: NOT DETECTED

## 2017-09-09 NOTE — Progress Notes (Signed)
Negative, Released to mychart 

## 2017-09-12 ENCOUNTER — Other Ambulatory Visit: Payer: Self-pay

## 2017-09-12 ENCOUNTER — Ambulatory Visit: Payer: BLUE CROSS/BLUE SHIELD | Admitting: Family Medicine

## 2017-09-13 ENCOUNTER — Telehealth: Payer: Self-pay | Admitting: Obstetrics and Gynecology

## 2017-09-13 NOTE — Telephone Encounter (Signed)
Patient is calling for labs results. Please advise. 

## 2017-09-15 NOTE — Telephone Encounter (Signed)
Her pap smear was normal. I released it to her mychart, please call and let her know. Thank you, Dr. Jerene PitchSchuman

## 2017-09-15 NOTE — Telephone Encounter (Signed)
Pt called and left vm stating schuman had left a message on mychart, for pt to call if she had any concerns or questions

## 2017-09-17 ENCOUNTER — Other Ambulatory Visit: Payer: Self-pay | Admitting: Family Medicine

## 2017-09-17 DIAGNOSIS — F320 Major depressive disorder, single episode, mild: Secondary | ICD-10-CM

## 2017-09-27 ENCOUNTER — Encounter: Payer: Self-pay | Admitting: Family Medicine

## 2017-09-27 ENCOUNTER — Ambulatory Visit (INDEPENDENT_AMBULATORY_CARE_PROVIDER_SITE_OTHER): Payer: BLUE CROSS/BLUE SHIELD | Admitting: Family Medicine

## 2017-09-27 VITALS — BP 100/62 | HR 68 | Ht 64.0 in | Wt 161.0 lb

## 2017-09-27 DIAGNOSIS — Z23 Encounter for immunization: Secondary | ICD-10-CM | POA: Diagnosis not present

## 2017-09-27 DIAGNOSIS — F320 Major depressive disorder, single episode, mild: Secondary | ICD-10-CM | POA: Diagnosis not present

## 2017-09-27 MED ORDER — SERTRALINE HCL 50 MG PO TABS: ORAL_TABLET | ORAL | 1 refills | Status: DC

## 2017-09-27 NOTE — Progress Notes (Signed)
Name: Joanna Huffman   MRN: [id number]    DOB: 1975-08-19   Date:09/27/2017       Progress Note  Subjective  Chief Complaint  Chief Complaint  Patient presents with  . Depression    medicine is helping- feeling better    Depression         This is a new problem.  The current episode started more than 1 month ago.   The onset quality is gradual.   The problem occurs constantly.  The problem has been gradually improving since onset.  Associated symptoms include no decreased concentration, no fatigue, no helplessness, no hopelessness, does not have insomnia, not irritable, no restlessness, no decreased interest, no appetite change, no body aches, no myalgias, no headaches, no indigestion, not sad and no suicidal ideas.     The symptoms are aggravated by nothing.  Past treatments include SSRIs - Selective serotonin reuptake inhibitors.  Compliance with treatment is good.  Previous treatment provided significant relief.   Current mild episode of major depressive disorder (HCC) New onset Stable PHQ 0 Significant improvement on sertraline 50 mg. Continue at present dosage Recheck 6 months.   Past Medical History:  Diagnosis Date  . Hypertension     Past Surgical History:  Procedure Laterality Date  . AUGMENTATION MAMMAPLASTY Bilateral 2013  . BREAST SURGERY  2012   augmentation    Family History  Problem Relation Age of Onset  . Hypertension Mother   . Hypertension Father   . Diabetes Father   . Heart disease Paternal Grandmother   . Breast cancer Maternal Aunt   . Breast cancer Paternal Aunt     Social History   Socioeconomic History  . Marital status: Married    Spouse name: Not on file  . Number of children: Not on file  . Years of education: Not on file  . Highest education level: Not on file  Occupational History  . Not on file  Social Needs  . Financial resource strain: Not on file  . Food insecurity:    Worry: Not on file    Inability: Not on file  .  Transportation needs:    Medical: Not on file    Non-medical: Not on file  Tobacco Use  . Smoking status: Former Smoker    Types: Cigarettes  . Smokeless tobacco: Never Used  Substance and Sexual Activity  . Alcohol use: Yes  . Drug use: No  . Sexual activity: Yes    Birth control/protection: Pill  Lifestyle  . Physical activity:    Days per week: Not on file    Minutes per session: Not on file  . Stress: Not on file  Relationships  . Social connections:    Talks on phone: Not on file    Gets together: Not on file    Attends religious service: Not on file    Active member of club or organization: Not on file    Attends meetings of clubs or organizations: Not on file    Relationship status: Not on file  . Intimate partner violence:    Fear of current or ex partner: Not on file    Emotionally abused: Not on file    Physically abused: Not on file    Forced sexual activity: Not on file  Other Topics Concern  . Not on file  Social History Narrative  . Not on file    No Known Allergies  Outpatient Medications Prior to Visit  Medication Sig Dispense  Refill  . Levonorgest-Eth Est & Eth Est (QUARTETTE) 42-21-21-7 DAYS TABS Take 1 tablet by mouth daily. 91 tablet 3  . lisinopril-hydrochlorothiazide (PRINZIDE,ZESTORETIC) 10-12.5 MG tablet Take 1 tablet by mouth daily. 30 tablet 2  . sertraline (ZOLOFT) 50 MG tablet TAKE 1/2 TABLET BY MOUTH DAILY FOR 10 DAYS THEN TAKE 1 TAB DAILY THEREAFTER 90 tablet 0  . Levonorgest-Eth Est & Eth Est 42-21-21-7 DAYS TABS Take by mouth.     No facility-administered medications prior to visit.     Review of Systems  Constitutional: Negative for appetite change, chills, fatigue, fever, malaise/fatigue and weight loss.  HENT: Negative for ear discharge, ear pain and sore throat.   Eyes: Negative for blurred vision.  Respiratory: Negative for cough, sputum production, shortness of breath and wheezing.   Cardiovascular: Negative for chest pain,  palpitations and leg swelling.  Gastrointestinal: Negative for abdominal pain, blood in stool, constipation, diarrhea, heartburn, melena and nausea.  Genitourinary: Negative for dysuria, frequency, hematuria and urgency.  Musculoskeletal: Negative for back pain, joint pain, myalgias and neck pain.  Skin: Negative for rash.  Neurological: Negative for dizziness, tingling, sensory change, focal weakness and headaches.  Endo/Heme/Allergies: Negative for environmental allergies and polydipsia. Does not bruise/bleed easily.  Psychiatric/Behavioral: Positive for depression. Negative for decreased concentration and suicidal ideas. The patient is not nervous/anxious and does not have insomnia.      Objective  Vitals:   09/27/17 0808  BP: 100/62  Pulse: 68  Weight: 161 lb (73 kg)  Height: 5\' 4"  (1.626 m)    Physical Exam  Constitutional: She is not irritable. No distress.  HENT:  Head: Normocephalic and atraumatic.  Right Ear: External ear normal.  Left Ear: External ear normal.  Nose: Nose normal.  Mouth/Throat: Oropharynx is clear and moist.  Eyes: Pupils are equal, round, and reactive to light. Conjunctivae and EOM are normal. Right eye exhibits no discharge. Left eye exhibits no discharge.  Neck: Normal range of motion. Neck supple. No JVD present. No thyromegaly present.  Cardiovascular: Normal rate, regular rhythm, normal heart sounds and intact distal pulses. Exam reveals no gallop and no friction rub.  No murmur heard. Pulmonary/Chest: Effort normal and breath sounds normal.  Abdominal: Soft. Bowel sounds are normal. She exhibits no mass. There is no tenderness. There is no guarding.  Musculoskeletal: Normal range of motion. She exhibits no edema.  Lymphadenopathy:    She has no cervical adenopathy.  Neurological: She is alert. She has normal reflexes.  Skin: Skin is warm and dry. She is not diaphoretic.      Assessment & Plan  Problem List Items Addressed This Visit       Other   Current mild episode of major depressive disorder (HCC) - Primary    New onset Stable PHQ 0 Significant improvement on sertraline 50 mg. Continue at present dosage Recheck 6 months.      Relevant Medications   sertraline (ZOLOFT) 50 MG tablet    Other Visit Diagnoses    Need for diphtheria-tetanus-pertussis (Tdap) vaccine       Discussion and administered.   Relevant Orders   Tdap vaccine greater than or equal to 7yo IM (Completed)      Meds ordered this encounter  Medications  . sertraline (ZOLOFT) 50 MG tablet    Sig: Take one a day    Dispense:  90 tablet    Refill:  1      Dr. [facility]  09/27/17   

## 2017-09-27 NOTE — Assessment & Plan Note (Signed)
New onset Stable PHQ 0 Significant improvement on sertraline 50 mg. Continue at present dosage Recheck 6 months.

## 2017-10-24 ENCOUNTER — Other Ambulatory Visit: Payer: Self-pay | Admitting: Family Medicine

## 2017-10-24 DIAGNOSIS — I10 Essential (primary) hypertension: Secondary | ICD-10-CM

## 2018-01-21 ENCOUNTER — Other Ambulatory Visit: Payer: Self-pay | Admitting: Family Medicine

## 2018-01-21 DIAGNOSIS — I10 Essential (primary) hypertension: Secondary | ICD-10-CM

## 2018-04-16 ENCOUNTER — Other Ambulatory Visit: Payer: Self-pay | Admitting: Family Medicine

## 2018-04-16 DIAGNOSIS — I10 Essential (primary) hypertension: Secondary | ICD-10-CM

## 2018-05-24 ENCOUNTER — Other Ambulatory Visit: Payer: Self-pay | Admitting: Family Medicine

## 2018-05-24 DIAGNOSIS — I10 Essential (primary) hypertension: Secondary | ICD-10-CM

## 2018-06-16 ENCOUNTER — Other Ambulatory Visit: Payer: Self-pay | Admitting: Family Medicine

## 2018-06-16 DIAGNOSIS — I10 Essential (primary) hypertension: Secondary | ICD-10-CM

## 2018-06-23 ENCOUNTER — Other Ambulatory Visit: Payer: Self-pay | Admitting: Family Medicine

## 2018-06-23 DIAGNOSIS — F320 Major depressive disorder, single episode, mild: Secondary | ICD-10-CM

## 2018-06-26 ENCOUNTER — Other Ambulatory Visit: Payer: Self-pay

## 2018-06-26 ENCOUNTER — Ambulatory Visit (INDEPENDENT_AMBULATORY_CARE_PROVIDER_SITE_OTHER): Payer: BLUE CROSS/BLUE SHIELD | Admitting: Family Medicine

## 2018-06-26 ENCOUNTER — Encounter: Payer: Self-pay | Admitting: Family Medicine

## 2018-06-26 VITALS — BP 130/80 | HR 80 | Ht 64.0 in | Wt 162.0 lb

## 2018-06-26 DIAGNOSIS — F320 Major depressive disorder, single episode, mild: Secondary | ICD-10-CM | POA: Diagnosis not present

## 2018-06-26 DIAGNOSIS — I10 Essential (primary) hypertension: Secondary | ICD-10-CM

## 2018-06-26 MED ORDER — LISINOPRIL-HYDROCHLOROTHIAZIDE 10-12.5 MG PO TABS: 1.0000 | ORAL_TABLET | Freq: Every day | ORAL | 1 refills | Status: DC

## 2018-06-26 MED ORDER — SERTRALINE HCL 50 MG PO TABS: ORAL_TABLET | ORAL | 1 refills | Status: DC

## 2018-06-26 NOTE — Progress Notes (Signed)
Date:  06/26/2018   Name:  Joanna Huffman   DOB:  1975/05/28   MRN:  [id number]   Chief Complaint: Depression (pHQ9=0) and Hypertension  Depression         This is a chronic problem.  The current episode started more than 1 year ago.   The onset quality is gradual.   The problem occurs intermittently.  The problem has been gradually improving since onset.  Associated symptoms include no decreased concentration, no fatigue, no helplessness, no hopelessness, does not have insomnia, not irritable, no restlessness, no decreased interest, no appetite change, no body aches, no myalgias, no headaches, no indigestion, not sad and no suicidal ideas.     Exacerbated by: covid circumstances.  Past treatments include SSRIs - Selective serotonin reuptake inhibitors.  Compliance with treatment is good.  Past compliance problems include difficulty with treatment plan.  Previous treatment provided mild relief.   Pertinent negatives include no anxiety. Hypertension  This is a chronic problem. The current episode started more than 1 year ago. The problem is unchanged. The problem is controlled. Pertinent negatives include no anxiety, blurred vision, chest pain, headaches, malaise/fatigue, neck pain, orthopnea, palpitations, peripheral edema, PND, shortness of breath or sweats. There are no associated agents to hypertension. There are no known risk factors for coronary artery disease.    Review of Systems  Constitutional: Negative for appetite change, fatigue and malaise/fatigue.  Eyes: Negative for blurred vision.  Respiratory: Negative for shortness of breath.   Cardiovascular: Negative for chest pain, palpitations, orthopnea and PND.  Musculoskeletal: Negative for myalgias and neck pain.  Neurological: Negative for headaches.  Psychiatric/Behavioral: Positive for depression. Negative for decreased concentration and suicidal ideas. The patient does not have insomnia.     Patient Active Problem List   Diagnosis Date Noted  . Current mild episode of major depressive disorder (HCC) 09/27/2017  . Screening for breast cancer 06/29/2016  . Blood pressure elevated without history of HTN 05/10/2016  . Asymptomatic PVCs 05/10/2016    No Known Allergies  Past Surgical History:  Procedure Laterality Date  . AUGMENTATION MAMMAPLASTY Bilateral 2013  . BREAST SURGERY  2012   augmentation    Social History   Tobacco Use  . Smoking status: Former Smoker    Types: Cigarettes  . Smokeless tobacco: Never Used  Substance Use Topics  . Alcohol use: Yes  . Drug use: No     Medication list has been reviewed and updated.  Current Meds  Medication Sig  . Levonorgest-Eth Est & Eth Est (QUARTETTE) 42-21-21-7 DAYS TABS Take 1 tablet by mouth daily.  Marland Kitchen. lisinopril-hydrochlorothiazide (ZESTORETIC) 10-12.5 MG tablet TAKE 1 TABLET BY MOUTH EVERY DAY  . sertraline (ZOLOFT) 50 MG tablet TAKE 1 TABLET BY MOUTH EVERY DAY    PHQ 2/9 Scores 06/26/2018 09/27/2017 08/26/2017  PHQ - 2 Score 0 0 6  PHQ- 9 Score 0 0 17    BP Readings from Last 3 Encounters:  06/26/18 130/80  09/27/17 100/62  09/06/17 110/68    Physical Exam Constitutional:      General: She is not irritable.    Wt Readings from Last 3 Encounters:  06/26/18 162 lb (73.5 kg)  09/27/17 161 lb (73 kg)  09/06/17 162 lb (73.5 kg)    BP 130/80   Pulse 80   Ht 5\' 4"  (1.626 m)   Wt 162 lb (73.5 kg)   BMI 27.81 kg/m   Assessment and Plan: 1. Current mild episode of major  depressive disorder, unspecified whether recurrent ([place]) PHQ 2 was 0.  Patient has had no concerns with depression and feels very well controlled on the sertraline 50 mg and therefore it will be continued for the next 6 months. - sertraline (ZOLOFT) 50 MG tablet; TAKE 1 TABLET BY MOUTH EVERY DAY  Dispense: 90 tablet; Refill: 1  2. Essential hypertension On it.  Controlled.  Continue lisinopril hydrochlorothiazide 10-12 0.5 once a day will check a renal function  panel. - lisinopril-hydrochlorothiazide (ZESTORETIC) 10-12.5 MG tablet; Take 1 tablet by mouth daily.  Dispense: 90 tablet; Refill: 1 - Renal Function Panel  3. Current mild episode of major depressive disorder, unspecified whether recurrent ([place]) As noted above and this is a duplicate. - sertraline (ZOLOFT) 50 MG tablet; TAKE 1 TABLET BY MOUTH EVERY DAY  Dispense: 90 tablet; Refill: 1

## 2018-06-27 LAB — RENAL FUNCTION PANEL
Albumin: 4.6 g/dL (ref 3.8–4.8)
BUN/Creatinine Ratio: 19 (ref 9–23)
BUN: 15 mg/dL (ref 6–24)
CO2: 23 mmol/L (ref 20–29)
Calcium: 9.8 mg/dL (ref 8.7–10.2)
Chloride: 98 mmol/L (ref 96–106)
Creatinine, Ser: 0.77 mg/dL (ref 0.57–1.00)
GFR calc Af Amer: 108 mL/min/{1.73_m2} (ref >59)
GFR calc non Af Amer: 94 mL/min/{1.73_m2} (ref >59)
Glucose: 83 mg/dL (ref 65–99)
Phosphorus: 3.3 mg/dL (ref 3.0–4.3)
Potassium: 4.2 mmol/L (ref 3.5–5.2)
Sodium: 136 mmol/L (ref 134–144)

## 2018-08-24 IMAGING — MG MM DIGITAL SCREENING IMPLANTS W/ CAD
8 series · 8 of 8 positions shown · non-contrast
Comparison: Previous exam(s).

CLINICAL DATA: Screening.

EXAM:
DIGITAL SCREENING BILATERAL MAMMOGRAM WITH IMPLANTS AND CAD
The patient has retropectoral implants. Standard and implant
displaced views were performed.

[R CC (1 of 2)]
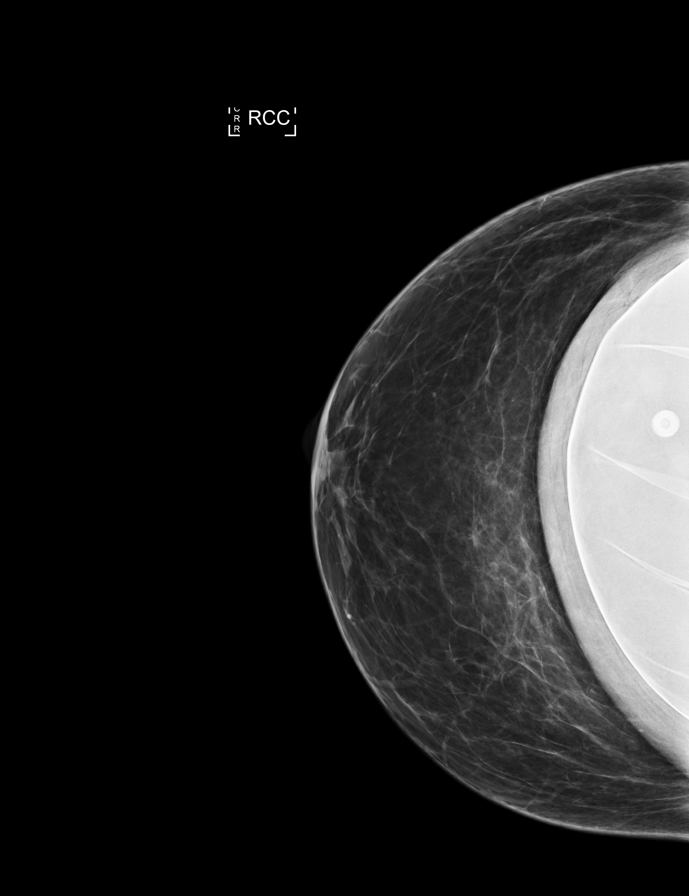

[L CC (1 of 2)]
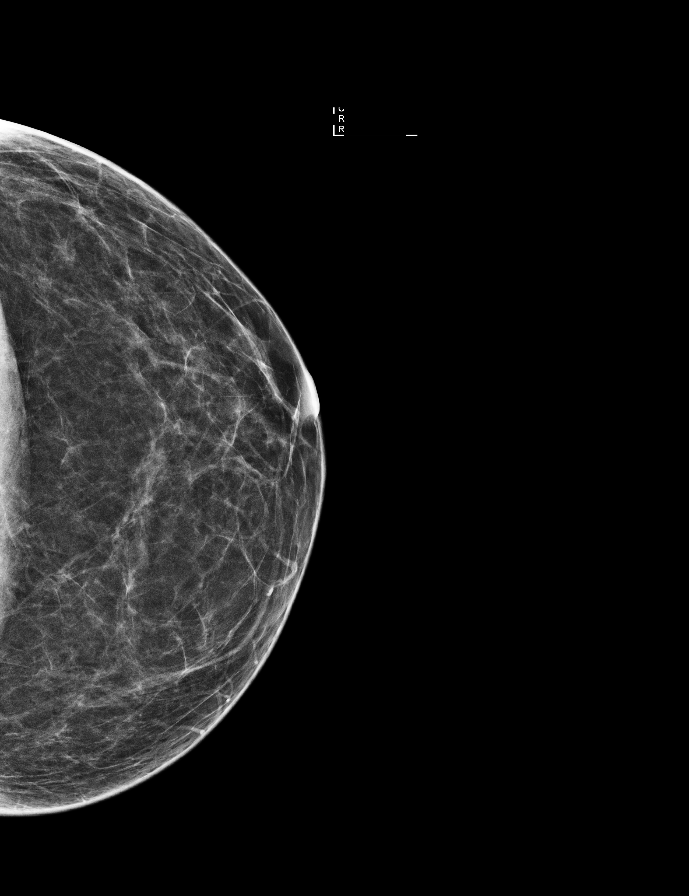

[R MLO (1 of 2)]
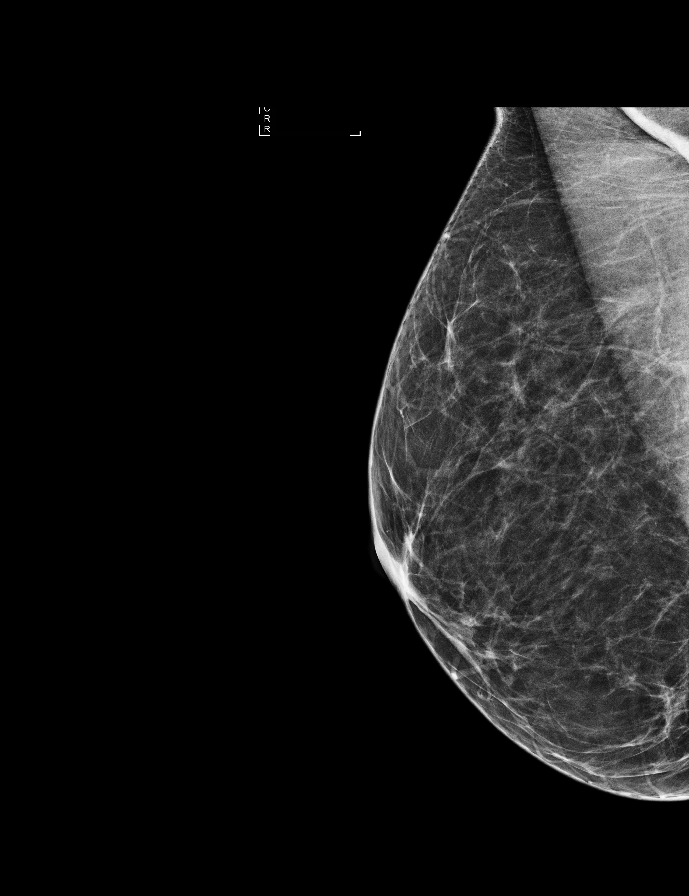

[L MLO (1 of 2)]
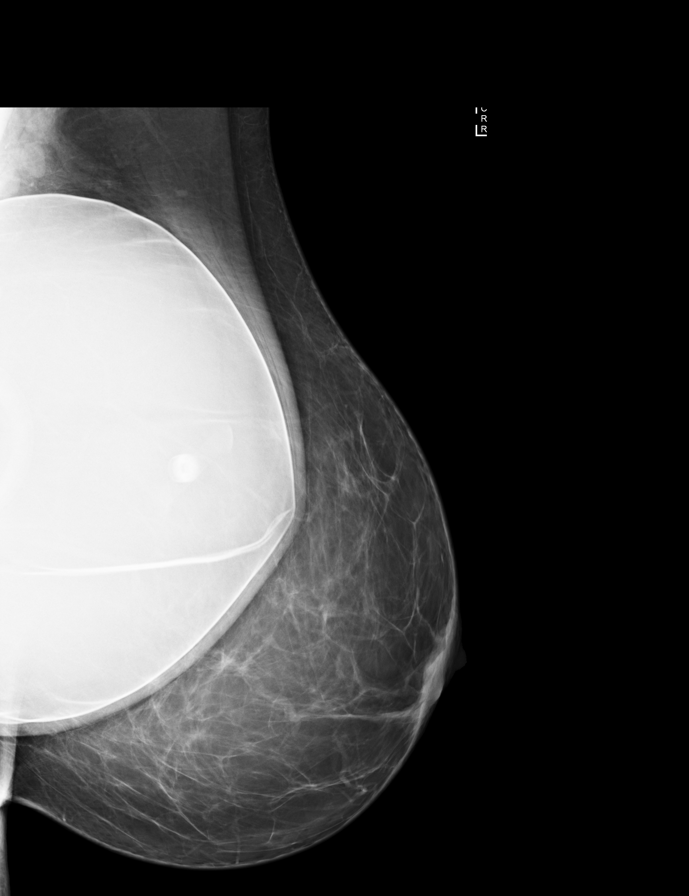

[R MLO (2 of 2)]
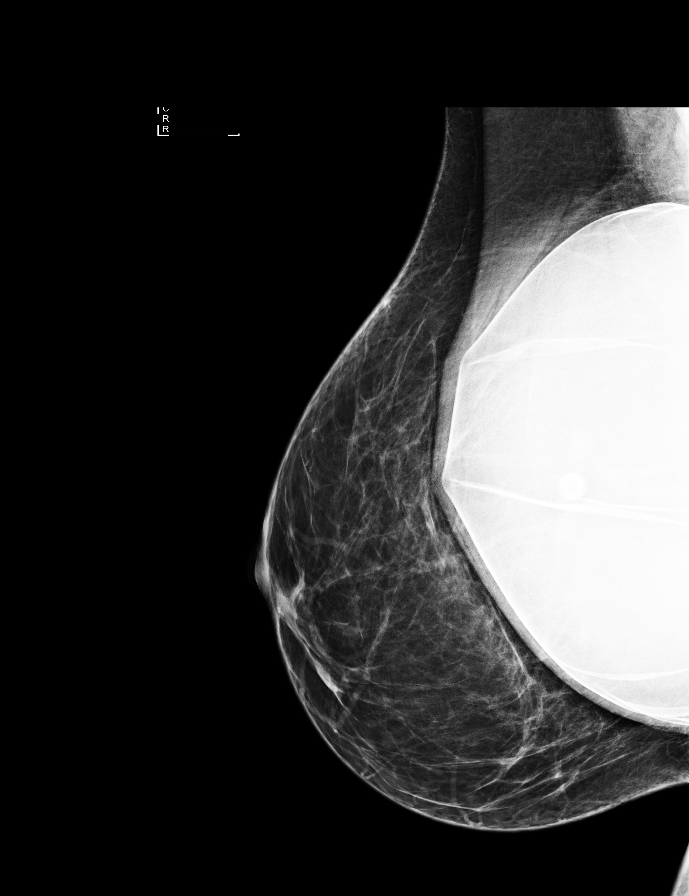

[R CC (2 of 2)]
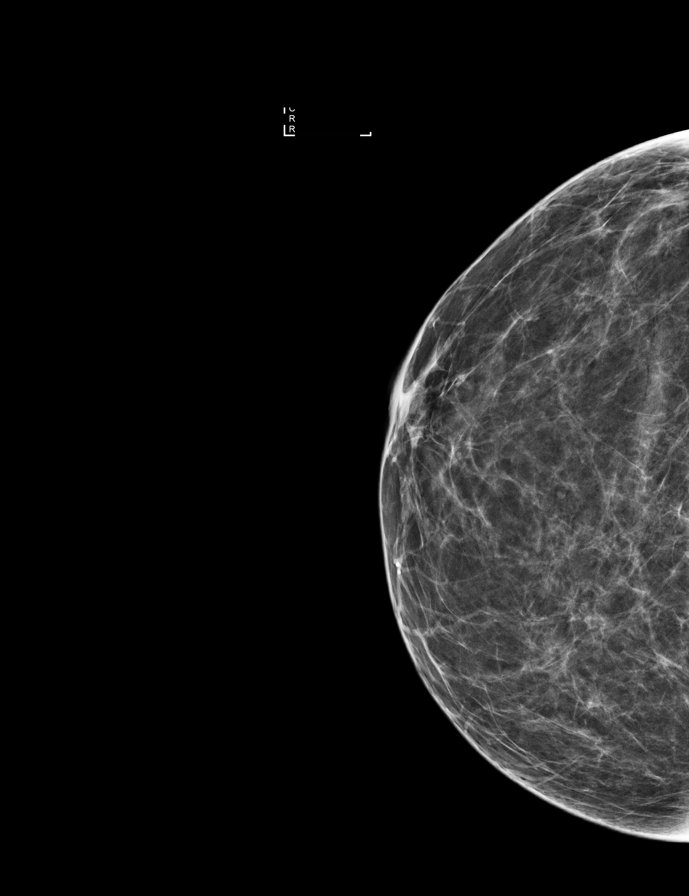

[L CC (2 of 2)]
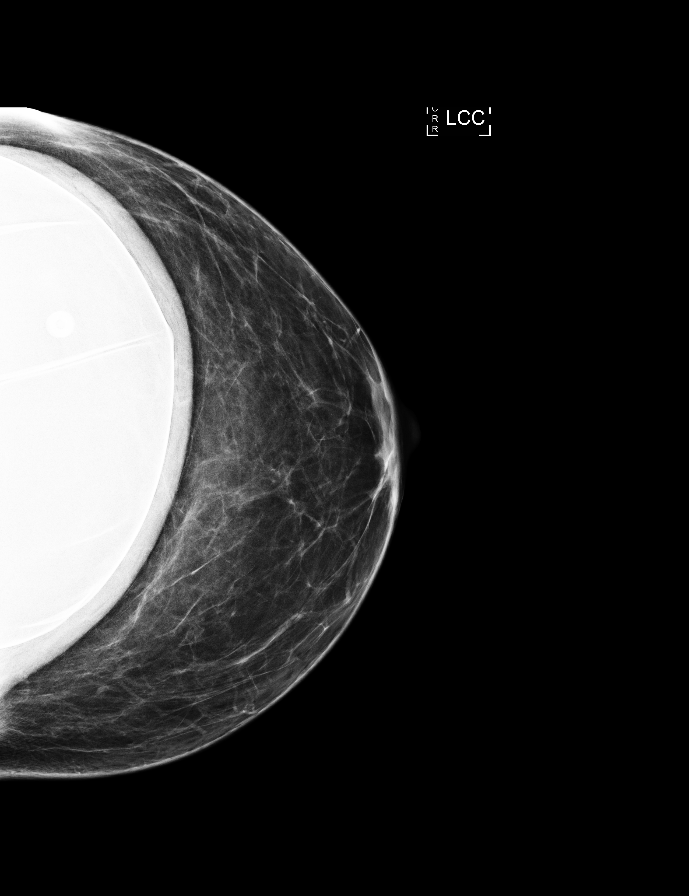

[L MLO (2 of 2)]
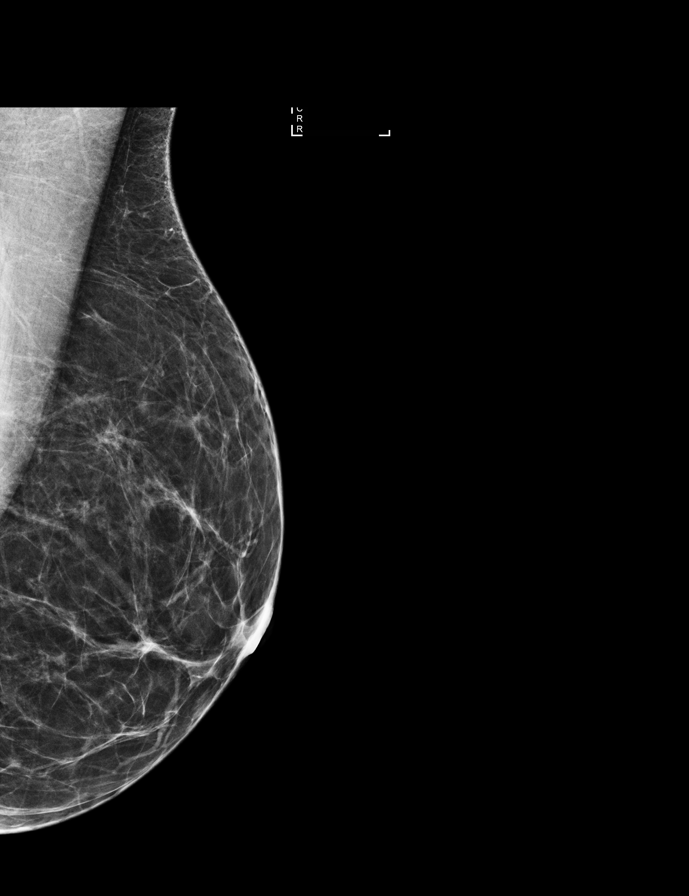

[8 of 8 positions shown; findings below may reference images not displayed]

ACR Breast Density Category b: There are scattered areas of
fibroglandular density.
FINDINGS: There are no findings suspicious for malignancy. Images were
processed with CAD.
IMPRESSION: No mammographic evidence of malignancy. A result letter of this
screening mammogram will be mailed directly to the patient.

RECOMMENDATION:
Screening mammogram in one year. (Code:KD-9-HRT)

BI-RADS CATEGORY  1:  Negative.

## 2018-09-21 ENCOUNTER — Other Ambulatory Visit: Payer: Self-pay | Admitting: Obstetrics and Gynecology

## 2018-09-21 DIAGNOSIS — Z0000 Encounter for general adult medical examination without abnormal findings: Secondary | ICD-10-CM

## 2018-10-04 ENCOUNTER — Ambulatory Visit (INDEPENDENT_AMBULATORY_CARE_PROVIDER_SITE_OTHER): Payer: BLUE CROSS/BLUE SHIELD | Admitting: Obstetrics and Gynecology

## 2018-10-04 ENCOUNTER — Inpatient Hospital Stay (HOSPITAL_COMMUNITY): Admit: 2018-10-04 | Payer: BLUE CROSS/BLUE SHIELD

## 2018-10-04 ENCOUNTER — Other Ambulatory Visit: Payer: Self-pay

## 2018-10-04 ENCOUNTER — Encounter: Payer: Self-pay | Admitting: Obstetrics and Gynecology

## 2018-10-04 VITALS — BP 115/72 | HR 82 | Ht 64.0 in | Wt 172.0 lb

## 2018-10-04 DIAGNOSIS — Z124 Encounter for screening for malignant neoplasm of cervix: Secondary | ICD-10-CM

## 2018-10-04 DIAGNOSIS — Z3041 Encounter for surveillance of contraceptive pills: Secondary | ICD-10-CM

## 2018-10-04 DIAGNOSIS — I10 Essential (primary) hypertension: Secondary | ICD-10-CM

## 2018-10-04 DIAGNOSIS — Z01419 Encounter for gynecological examination (general) (routine) without abnormal findings: Secondary | ICD-10-CM | POA: Diagnosis not present

## 2018-10-04 DIAGNOSIS — Z1239 Encounter for other screening for malignant neoplasm of breast: Secondary | ICD-10-CM

## 2018-10-04 DIAGNOSIS — N951 Menopausal and female climacteric states: Secondary | ICD-10-CM

## 2018-10-04 MED ORDER — SLYND 4 MG PO TABS: 1.0000 | ORAL_TABLET | Freq: Every day | ORAL | 3 refills | Status: DC

## 2018-10-04 NOTE — Progress Notes (Signed)
Gynecology Annual Exam  PCP: Duanne LimerickJones, Deanna C, MD  Chief Complaint:  Chief Complaint  Patient presents with  . Gynecologic Exam    History of Present Illness: Patient is a 43 y.o. [id number] presents for annual exam. The patient has no complaints today.   LMP: Patient's last menstrual period was 09/30/2018 (exact date). Average Interval: regular, every 3 months on extended cycle pill.   Duration of flow: short 3-4 days, has skipped before as well Heavy Menses: no Clots: no Intermenstrual Bleeding: no Postcoital Bleeding: no Dysmenorrhea: no   The patient is sexually active. She currently uses OCP (estrogen/progesterone) for contraception. She denies dyspareunia.  The patient does perform self breast exams.  There is no notable family history of breast or ovarian cancer in her family.  The patient wears seatbelts: yes.   The patient has regular exercise: not asked.    The patient denies current symptoms of depression.  Was previously on Zoloft but self discontinued.  She has noted some vasomotor symptoms as well as weight gain.  She reports her mother was mid to late 6840's when she went through menopause.  Review of Systems: Review of Systems  Constitutional: Negative for chills and fever.  HENT: Negative for congestion.   Respiratory: Negative for cough and shortness of breath.   Cardiovascular: Negative for chest pain and palpitations.  Gastrointestinal: Negative for abdominal pain, constipation, diarrhea, heartburn, nausea and vomiting.  Genitourinary: Negative for dysuria, frequency and urgency.  Skin: Negative for itching and rash.  Neurological: Negative for dizziness and headaches.  Endo/Heme/Allergies: Negative for polydipsia.  Psychiatric/Behavioral: Negative for depression.    Past Medical History:  Past Medical History:  Diagnosis Date  . Hypertension     Past Surgical History:  Past Surgical History:  Procedure Laterality Date  . AUGMENTATION MAMMAPLASTY  Bilateral 2013  . BREAST SURGERY  2012   augmentation    Gynecologic History:  Patient's last menstrual period was 09/30/2018 (exact date). Contraception: OCP (estrogen/progesterone) Last Pap: Results were: 09/06/2017 NIL and HR HPV negative  Last mammogram: 07/14/2014 [facility]  Obstetric History: [id number]: G3P3003  Family History:  Family History  Problem Relation Age of Onset  . Hypertension Mother   . Hypertension Father   . Diabetes Father   . Heart disease Paternal Grandmother   . Breast cancer Maternal Aunt   . Breast cancer Paternal Aunt     Social History:  Social History   Socioeconomic History  . Marital status: Married    Spouse name: Not on file  . Number of children: Not on file  . Years of education: Not on file  . Highest education level: Not on file  Occupational History  . Not on file  Social Needs  . Financial resource strain: Not on file  . Food insecurity    Worry: Not on file    Inability: Not on file  . Transportation needs    Medical: Not on file    Non-medical: Not on file  Tobacco Use  . Smoking status: Former Smoker    Types: Cigarettes  . Smokeless tobacco: Never Used  Substance and Sexual Activity  . Alcohol use: Yes  . Drug use: No  . Sexual activity: Yes    Birth control/protection: Pill  Lifestyle  . Physical activity    Days per week: Not on file    Minutes per session: Not on file  . Stress: Not on file  Relationships  . Social connections  Talks on phone: Not on file    Gets together: Not on file    Attends religious service: Not on file    Active member of club or organization: Not on file    Attends meetings of clubs or organizations: Not on file    Relationship status: Not on file  . Intimate partner violence    Fear of current or ex partner: Not on file    Emotionally abused: Not on file    Physically abused: Not on file    Forced sexual activity: Not on file  Other Topics Concern  . Not  on file  Social History Narrative  . Not on file    Allergies:  No Known Allergies  Medications: Prior to Admission medications   Medication Sig Start Date End Date Taking? Authorizing Provider  Levonorgest-Eth Est & Eth Est 42-21-21-7 DAYS TABS TAKE 1 TABLET BY MOUTH EVERY DAY 09/21/18   Schuman, Christanna R, MD  lisinopril-hydrochlorothiazide (ZESTORETIC) 10-12.5 MG tablet Take 1 tablet by mouth daily. 06/26/18   Duanne Limerick, MD  sertraline (ZOLOFT) 50 MG tablet TAKE 1 TABLET BY MOUTH EVERY DAY 06/26/18   Duanne Limerick, MD    Physical Exam Vitals: Blood pressure 115/72, pulse 82, height 5\' 4"  (1.626 m), weight 172 lb (78 kg), last menstrual period 09/30/2018.  General: NAD HEENT: normocephalic, anicteric Thyroid: no enlargement, no palpable nodules Pulmonary: No increased work of breathing, CTAB Cardiovascular: RRR, distal pulses 2+ Breast: Breast symmetrical, no tenderness, no palpable nodules or masses, no skin or nipple retraction present, no nipple discharge.  No axillary or supraclavicular lymphadenopathy. Abdomen: NABS, soft, non-tender, non-distended.  Umbilicus without lesions.  No hepatomegaly, splenomegaly or masses palpable. No evidence of hernia  Genitourinary:  External: Normal external female genitalia.  Normal urethral meatus, normal Bartholin's and Skene's glands.    Vagina: Normal vaginal mucosa, no evidence of prolapse.    Cervix: Grossly normal in appearance, no bleeding  Uterus: Non-enlarged, mobile, normal contour.  No CMT  Adnexa: ovaries non-enlarged, no adnexal masses  Rectal: deferred  Lymphatic: no evidence of inguinal lymphadenopathy Extremities: no edema, erythema, or tenderness Neurologic: Grossly intact Psychiatric: mood appropriate, affect full  Female chaperone present for pelvic and breast  portions of the physical exam    Assessment: 43 y.o. G3P3003 routine annual exam  Plan: Problem List Items Addressed This Visit    None    Visit  Diagnoses    Encounter for gynecological examination without abnormal finding    -  Primary   Relevant Orders   TSH   Estradiol   Follicle stimulating hormone   Breast screening       Relevant Orders   MM 3D SCREEN BREAST W/IMPLANT BILATERAL   Surveillance of previously prescribed contraceptive pill       Essential hypertension       Screening for malignant neoplasm of cervix       Relevant Orders   Cytology - PAP   Perimenopausal vasomotor symptoms       Relevant Orders   TSH   Estradiol   Follicle stimulating hormone      1) Mammogram - recommend yearly screening mammogram.  Mammogram Was ordered today   2) STI screening  was notoffered and therefore not obtained  3) ASCCP guidelines and rational discussed.  Patient opts for every 3 years screening interval  4) Contraception - currently on combination OCP age >70, and CHTN.  Discussed progestin only alternatives per CDC contraceptive use guidelines.  switch to slynd after this pack, sent coupon mychart - check FSH/estradiol [facility] not reliable given contraceptive use)   - TSH  5) Colonoscopy --  At age 48  6) Routine healthcare maintenance including cholesterol, diabetes screening discussed managed by PCP  7) Return in about 6 months (around 04/03/2019) for medication telephone follow up.   Malachy Mood, MD, [facility], [facility] 10/04/2018, 1:29 PM

## 2018-10-04 NOTE — Patient Instructions (Signed)
[facility] [address] [place] [address]  [facility]  [address] [place] [address]  Phone: [phone]  

## 2018-10-05 ENCOUNTER — Other Ambulatory Visit: Payer: Self-pay

## 2018-10-05 LAB — ESTRADIOL: Estradiol: 39.8 pg/mL

## 2018-10-05 LAB — TSH: TSH: 1.34 u[IU]/mL (ref 0.450–4.500)

## 2018-10-05 LAB — FOLLICLE STIMULATING HORMONE: FSH: 8.1 m[IU]/mL

## 2018-10-10 LAB — CYTOLOGY - PAP
Diagnosis: NEGATIVE
High risk HPV: NEGATIVE
Molecular Disclaimer: 56
Molecular Disclaimer: DETECTED
Molecular Disclaimer: NORMAL

## 2018-10-18 ENCOUNTER — Other Ambulatory Visit: Payer: Self-pay

## 2018-10-18 ENCOUNTER — Ambulatory Visit
Admission: RE | Admit: 2018-10-18 | Discharge: 2018-10-18 | Disposition: A | Discharge status: Home / Self Care | Payer: BLUE CROSS/BLUE SHIELD | Source: Ambulatory Visit | Attending: Obstetrics and Gynecology | Admitting: Obstetrics and Gynecology

## 2018-10-18 DIAGNOSIS — Z1239 Encounter for other screening for malignant neoplasm of breast: Secondary | ICD-10-CM

## 2018-10-18 DIAGNOSIS — Z1231 Encounter for screening mammogram for malignant neoplasm of breast: Secondary | ICD-10-CM | POA: Diagnosis not present

## 2018-12-04 ENCOUNTER — Ambulatory Visit
Admission: RE | Admit: 2018-12-04 | Discharge: 2018-12-04 | Disposition: A | Discharge status: Home / Self Care | Payer: BLUE CROSS/BLUE SHIELD | Attending: Family Medicine | Admitting: Family Medicine

## 2018-12-04 ENCOUNTER — Encounter: Payer: Self-pay | Admitting: Family Medicine

## 2018-12-04 ENCOUNTER — Other Ambulatory Visit: Payer: Self-pay

## 2018-12-04 ENCOUNTER — Ambulatory Visit
Admission: RE | Admit: 2018-12-04 | Discharge: 2018-12-04 | Disposition: A | Discharge status: Home / Self Care | Payer: BLUE CROSS/BLUE SHIELD | Source: Ambulatory Visit | Attending: Family Medicine | Admitting: Family Medicine

## 2018-12-04 ENCOUNTER — Ambulatory Visit (INDEPENDENT_AMBULATORY_CARE_PROVIDER_SITE_OTHER): Payer: BLUE CROSS/BLUE SHIELD | Admitting: Family Medicine

## 2018-12-04 VITALS — BP 130/70 | HR 80 | Ht 64.0 in | Wt 172.0 lb

## 2018-12-04 DIAGNOSIS — M5136 Other intervertebral disc degeneration, lumbar region: Secondary | ICD-10-CM

## 2018-12-04 DIAGNOSIS — I10 Essential (primary) hypertension: Secondary | ICD-10-CM

## 2018-12-04 DIAGNOSIS — M1990 Unspecified osteoarthritis, unspecified site: Secondary | ICD-10-CM | POA: Insufficient documentation

## 2018-12-04 MED ORDER — LISINOPRIL-HYDROCHLOROTHIAZIDE 10-12.5 MG PO TABS: 1.0000 | ORAL_TABLET | Freq: Every day | ORAL | 1 refills | Status: DC

## 2018-12-04 MED ORDER — MELOXICAM 15 MG PO TABS: 15.0000 mg | ORAL_TABLET | Freq: Every day | ORAL | 0 refills | Status: DC

## 2018-12-04 NOTE — Progress Notes (Signed)
Date:  12/04/2018   Name:  Joanna Huffman   DOB:  1975/12/04   MRN:  [id number]   Chief Complaint: Back Pain (starts at lower back and runs down L) side of buttocks and back of L) leg)  Back Pain This is a new (4 to 5 months) problem. The problem occurs constantly. The problem has been waxing and waning since onset. The pain is present in the lumbar spine and gluteal. The quality of the pain is described as aching. The pain is moderate. Associated symptoms include leg pain. Pertinent negatives include no abdominal pain, bladder incontinence, bowel incontinence, chest pain, dysuria, fever, headaches, numbness, paresis, paresthesias, tingling, weakness or weight loss. Risk factors: history of scoliosis/weighted squats. She has tried NSAIDs and home exercises for the symptoms. The treatment provided no relief.  Knee Pain  The incident occurred more than 1 week ago (years). There was no injury mechanism (history of scoliosis). The pain is present in the right knee and left knee. The quality of the pain is described as aching. The pain has been intermittent since onset. Pertinent negatives include no numbness or tingling.    Lab Results  Component Value Date   CREATININE 0.77 06/26/2018   BUN 15 06/26/2018   NA 136 06/26/2018   K 4.2 06/26/2018   CL 98 06/26/2018   CO2 23 06/26/2018   No results found for: CHOL, HDL, LDLCALC, LDLDIRECT, TRIG, CHOLHDL Lab Results  Component Value Date   TSH 1.340 10/04/2018   No results found for: HGBA1C   Review of Systems  Constitutional: Negative for chills, fever and weight loss.  HENT: Negative for drooling, ear discharge, ear pain and sore throat.   Respiratory: Negative for cough, shortness of breath and wheezing.   Cardiovascular: Negative for chest pain, palpitations and leg swelling.  Gastrointestinal: Negative for abdominal pain, blood in stool, bowel incontinence, constipation, diarrhea and nausea.  Endocrine: Negative for polydipsia.   Genitourinary: Negative for bladder incontinence, dysuria, frequency, hematuria and urgency.  Musculoskeletal: Positive for back pain. Negative for myalgias and neck pain.  Skin: Negative for rash.  Allergic/Immunologic: Negative for environmental allergies.  Neurological: Negative for dizziness, tingling, weakness, numbness, headaches and paresthesias.  Hematological: Does not bruise/bleed easily.  Psychiatric/Behavioral: Negative for suicidal ideas. The patient is not nervous/anxious.     Patient Active Problem List   Diagnosis Date Noted  . Current mild episode of major depressive disorder (HCC) 09/27/2017  . Screening for breast cancer 06/29/2016  . Blood pressure elevated without history of HTN 05/10/2016  . Asymptomatic PVCs 05/10/2016    No Known Allergies  Past Surgical History:  Procedure Laterality Date  . AUGMENTATION MAMMAPLASTY Bilateral 2013  . BREAST SURGERY  2012   augmentation    Social History   Tobacco Use  . Smoking status: Former Smoker    Types: Cigarettes  . Smokeless tobacco: Never Used  Substance Use Topics  . Alcohol use: Yes  . Drug use: No     Medication list has been reviewed and updated.  Current Meds  Medication Sig  . lisinopril-hydrochlorothiazide (ZESTORETIC) 10-12.5 MG tablet Take 1 tablet by mouth daily.    PHQ 2/9 Scores 06/26/2018 06/26/2018 09/27/2017 08/26/2017  PHQ - 2 Score 0 0 0 6  PHQ- 9 Score - 0 0 17    BP Readings from Last 3 Encounters:  12/04/18 130/70  10/04/18 115/72  06/26/18 130/80    Physical Exam Vitals signs and nursing note reviewed.  Constitutional:  Appearance: She is well-developed.  HENT:     Head: Normocephalic.     Right Ear: Tympanic membrane, ear canal and external ear normal.     Left Ear: Tympanic membrane, ear canal and external ear normal.  Eyes:     General: Lids are everted, no foreign bodies appreciated. No scleral icterus.       Left eye: No foreign body or hordeolum.      Conjunctiva/sclera: Conjunctivae normal.     Right eye: Right conjunctiva is not injected.     Left eye: Left conjunctiva is not injected.     Pupils: Pupils are equal, round, and reactive to light.  Neck:     Musculoskeletal: Normal range of motion and neck supple.     Thyroid: No thyromegaly.     Vascular: No JVD.     Trachea: No tracheal deviation.  Cardiovascular:     Rate and Rhythm: Normal rate and regular rhythm.     Heart sounds: Normal heart sounds. No murmur. No friction rub. No gallop.   Pulmonary:     Effort: Pulmonary effort is normal. No respiratory distress.     Breath sounds: Normal breath sounds. No wheezing or rales.  Abdominal:     General: Bowel sounds are normal.     Palpations: Abdomen is soft. There is no mass.     Tenderness: There is no abdominal tenderness. There is no guarding or rebound.  Musculoskeletal:        General: No tenderness.     Lumbar back: She exhibits decreased range of motion, deformity, pain and spasm.       Back:     Comments: Scoliosis/ positive straight leg raise left  Lymphadenopathy:     Cervical: No cervical adenopathy.  Skin:    General: Skin is warm.     Coloration: Skin is not pale.     Findings: No rash.  Neurological:     General: No focal deficit present.     Mental Status: She is alert and oriented to person, place, and time.     Cranial Nerves: Cranial nerves are intact. No cranial nerve deficit.     Sensory: Sensation is intact.     Motor: Motor function is intact.     Deep Tendon Reflexes: Reflexes are normal and symmetric. Reflexes normal.     Reflex Scores:      Tricep reflexes are 2+ on the right side and 2+ on the left side.      Bicep reflexes are 2+ on the right side and 2+ on the left side.      Brachioradialis reflexes are 2+ on the right side and 2+ on the left side.      Patellar reflexes are 2+ on the right side and 2+ on the left side.      Achilles reflexes are 2+ on the right side and 2+ on the left  side. Psychiatric:        Mood and Affect: Mood is not anxious or depressed.     Wt Readings from Last 3 Encounters:  12/04/18 172 lb (78 kg)  10/04/18 172 lb (78 kg)  06/26/18 162 lb (73.5 kg)    BP 130/70   Pulse 80   Ht 5\' 4"  (1.626 m)   Wt 172 lb (78 kg)   LMP 09/19/2018 (Approximate)   BMI 29.52 kg/m   Assessment and Plan: 1. Arthritis Patient has been experiencing bilateral knee pain.  This is gradually been worsening despite  her increase in activity.  Will initiate meloxicam 15 mg once a day. - meloxicam (MOBIC) 15 MG tablet; Take 1 tablet (15 mg total) by mouth daily.  Dispense: 30 tablet; Refill: 0 - DG Lumbar Spine Complete; Future  2. Essential hypertension Chronic.  Controlled.  Continue lisinopril hydrochlorothiazide 10-12 0.5 once a day. - lisinopril-hydrochlorothiazide (ZESTORETIC) 10-12.5 MG tablet; Take 1 tablet by mouth daily.  Dispense: 90 tablet; Refill: 1  3. Essential hypertension Chronic.  Controlled.  Continue lisinopril hydrochlorothiazide. - lisinopril-hydrochlorothiazide (ZESTORETIC) 10-12.5 MG tablet; Take 1 tablet by mouth daily.  Dispense: 90 tablet; Refill: 1  4. Lumbar degenerative disc disease This is new problem with degenerative disease with an underlying scoliosis.  Patient has continued to exercise extensively including elliptical, and squats that included weighted squats.  Patient has gradually continued to have pain in the lower back radiating to the left leg with an exam that is positive for straight leg raise on the left.  I suspect there is a degenerative disc here for the last 4 months and patient has been too active for its return to normal.  Will return for 2 orthopedic surgery Todd Monday for evaluation.  We will obtain an LS spine today to rule out compression fracture given the weighted squats and will initiate meloxicam 15 mg once a day. - meloxicam (MOBIC) 15 MG tablet; Take 1 tablet (15 mg total) by mouth daily.  Dispense: 30  tablet; Refill: 0 - Ambulatory referral to Orthopedic Surgery - DG Lumbar Spine Complete; Future

## 2019-06-17 ENCOUNTER — Other Ambulatory Visit: Payer: Self-pay | Admitting: Family Medicine

## 2019-06-17 DIAGNOSIS — I10 Essential (primary) hypertension: Secondary | ICD-10-CM

## 2019-06-17 NOTE — Telephone Encounter (Signed)
Requested medications are due for refill today?  Yes  Requested medications are on active medication list?  Yes  Last Refill:  12/04/2018  # 90 with one refill  Future visit scheduled?  No   Notes to Clinic:  Medication failed RX refill protocol due to no labs within 180 days.  Last labs were performed on 06/26/2018.

## 2019-06-22 ENCOUNTER — Ambulatory Visit (INDEPENDENT_AMBULATORY_CARE_PROVIDER_SITE_OTHER): Payer: BLUE CROSS/BLUE SHIELD | Admitting: Family Medicine

## 2019-06-22 ENCOUNTER — Encounter: Payer: Self-pay | Admitting: Family Medicine

## 2019-06-22 ENCOUNTER — Other Ambulatory Visit: Payer: Self-pay

## 2019-06-22 VITALS — BP 110/80 | HR 75 | Ht 64.0 in | Wt 170.0 lb

## 2019-06-22 DIAGNOSIS — I10 Essential (primary) hypertension: Secondary | ICD-10-CM | POA: Diagnosis not present

## 2019-06-22 MED ORDER — LISINOPRIL-HYDROCHLOROTHIAZIDE 10-12.5 MG PO TABS: 1.0000 | ORAL_TABLET | Freq: Every day | ORAL | 1 refills | Status: DC

## 2019-06-22 NOTE — Progress Notes (Signed)
Date:  06/22/2019   Name:  Joanna Huffman   DOB:  [birth year]   MRN:  [id number]   Chief Complaint: Hypertension (RF lisinopril/hctz)  Hypertension This is a chronic problem. The current episode started more than 1 year ago. The problem has been gradually improving since onset. The problem is controlled. Pertinent negatives include no anxiety, blurred vision, chest pain, headaches, malaise/fatigue, neck pain, orthopnea, palpitations, peripheral edema, PND, shortness of breath or sweats. There are no associated agents to hypertension. Past treatments include ACE inhibitors and diuretics. The current treatment provides moderate improvement. There are no compliance problems.  There is no history of angina, kidney disease, CAD/MI, CVA, heart failure, left ventricular hypertrophy, PVD or retinopathy. There is no history of chronic renal disease, a hypertension causing med or renovascular disease.    Lab Results  Component Value Date   CREATININE 0.77 06/26/2018   BUN 15 06/26/2018   NA 136 06/26/2018   K 4.2 06/26/2018   CL 98 06/26/2018   CO2 23 06/26/2018   No results found for: CHOL, HDL, LDLCALC, LDLDIRECT, TRIG, CHOLHDL Lab Results  Component Value Date   TSH 1.340 10/04/2018   No results found for: HGBA1C No results found for: WBC, HGB, HCT, MCV, PLT No results found for: ALT, AST, GGT, ALKPHOS, BILITOT   Review of Systems  Constitutional: Negative.  Negative for chills, fatigue, fever, malaise/fatigue and unexpected weight change.  HENT: Negative for congestion, ear discharge, ear pain, rhinorrhea, sinus pressure, sneezing and sore throat.   Eyes: Negative for blurred vision, photophobia, pain, discharge, redness and itching.  Respiratory: Negative for cough, shortness of breath, wheezing and stridor.   Cardiovascular: Negative for chest pain, palpitations, orthopnea and PND.  Gastrointestinal: Negative for abdominal pain, blood in stool, constipation, diarrhea, nausea and  vomiting.  Endocrine: Negative for cold intolerance, heat intolerance, polydipsia, polyphagia and polyuria.  Genitourinary: Negative for dysuria, flank pain, frequency, hematuria, menstrual problem, pelvic pain, urgency, vaginal bleeding and vaginal discharge.  Musculoskeletal: Negative for arthralgias, back pain, myalgias and neck pain.  Skin: Negative for rash.  Allergic/Immunologic: Negative for environmental allergies and food allergies.  Neurological: Negative for dizziness, weakness, light-headedness, numbness and headaches.  Hematological: Negative for adenopathy. Does not bruise/bleed easily.  Psychiatric/Behavioral: Negative for dysphoric mood. The patient is not nervous/anxious.     Patient Active Problem List   Diagnosis Date Noted  . Current mild episode of major depressive disorder ([place]) 09/27/2017  . Screening for breast cancer 06/29/2016  . Blood pressure elevated without history of HTN 05/10/2016  . Asymptomatic PVCs 05/10/2016    No Known Allergies  Past Surgical History:  Procedure Laterality Date  . AUGMENTATION MAMMAPLASTY Bilateral 2013  . BREAST SURGERY  2012   augmentation    Social History   Tobacco Use  . Smoking status: Former Smoker    Types: Cigarettes  . Smokeless tobacco: Never Used  Substance Use Topics  . Alcohol use: Yes  . Drug use: No     Medication list has been reviewed and updated.  Current Meds  Medication Sig  . Levonorgest-Eth Est & Eth Est 42-21-21-7 DAYS TABS Take 1 tablet by mouth daily.  Marland Kitchen lisinopril-hydrochlorothiazide (ZESTORETIC) 10-12.5 MG tablet TAKE 1 TABLET BY MOUTH EVERY DAY    PHQ 2/9 Scores 06/22/2019 06/26/2018 06/26/2018 09/27/2017  PHQ - 2 Score 0 0 0 0  PHQ- 9 Score 0 - 0 0    BP Readings from Last 3 Encounters:  06/22/19 110/80  12/04/18  130/70  10/04/18 115/72    Physical Exam Vitals and nursing note reviewed.  Constitutional:      Appearance: She is well-developed.  HENT:     Head: Normocephalic.      Right Ear: External ear normal.     Left Ear: External ear normal.  Eyes:     General: Lids are everted, no foreign bodies appreciated. No scleral icterus.       Left eye: No foreign body or hordeolum.     Conjunctiva/sclera: Conjunctivae normal.     Right eye: Right conjunctiva is not injected.     Left eye: Left conjunctiva is not injected.     Pupils: Pupils are equal, round, and reactive to light.  Neck:     Thyroid: No thyromegaly.     Vascular: No JVD.     Trachea: No tracheal deviation.  Cardiovascular:     Rate and Rhythm: Normal rate and regular rhythm.     Heart sounds: Normal heart sounds. No murmur. No friction rub. No gallop.   Pulmonary:     Effort: Pulmonary effort is normal. No respiratory distress.     Breath sounds: Normal breath sounds. No wheezing or rales.  Abdominal:     General: Bowel sounds are normal.     Palpations: Abdomen is soft. There is no mass.     Tenderness: There is no abdominal tenderness. There is no guarding or rebound.  Musculoskeletal:        General: No tenderness. Normal range of motion.     Cervical back: Normal range of motion and neck supple.  Lymphadenopathy:     Cervical: No cervical adenopathy.  Skin:    General: Skin is warm.     Findings: No rash.  Neurological:     Mental Status: She is alert and oriented to person, place, and time.     Cranial Nerves: No cranial nerve deficit.     Deep Tendon Reflexes: Reflexes normal.  Psychiatric:        Mood and Affect: Mood is not anxious or depressed.     Wt Readings from Last 3 Encounters:  06/22/19 170 lb (77.1 kg)  12/04/18 172 lb (78 kg)  10/04/18 172 lb (78 kg)    BP 110/80   Pulse 75   Ht 5\' 4"  (1.626 m)   Wt 170 lb (77.1 kg)   SpO2 97%   BMI 29.18 kg/m   Assessment and Plan:  1. Essential hypertension Chronic.  Controlled.  Stable.  Continue lisinopril hydrochlorothiazide 10-12.5 mg once a day.  Will check renal function panel for electrolytes and GFR.  We will  recheck patient in 6 months. - lisinopril-hydrochlorothiazide (ZESTORETIC) 10-12.5 MG tablet; Take 1 tablet by mouth daily.  Dispense: 90 tablet; Refill: 1

## 2019-09-17 ENCOUNTER — Other Ambulatory Visit: Payer: Self-pay

## 2019-09-17 ENCOUNTER — Other Ambulatory Visit: Payer: Self-pay | Admitting: Obstetrics and Gynecology

## 2019-09-17 ENCOUNTER — Telehealth: Payer: Self-pay | Admitting: Obstetrics and Gynecology

## 2019-09-17 DIAGNOSIS — Z0000 Encounter for general adult medical examination without abnormal findings: Secondary | ICD-10-CM

## 2019-09-17 MED ORDER — LEVONORGEST-ETH EST & ETH EST 42-21-21-7 DAYS PO TABS: 1.0000 | ORAL_TABLET | Freq: Every day | ORAL | 0 refills | Status: DC

## 2019-09-17 NOTE — Telephone Encounter (Signed)
Patient will need refill on bc, will run out before annual scheduled 9/22 with [facility].  [facility] [place].

## 2019-10-10 ENCOUNTER — Other Ambulatory Visit: Payer: Self-pay

## 2019-10-10 ENCOUNTER — Ambulatory Visit (INDEPENDENT_AMBULATORY_CARE_PROVIDER_SITE_OTHER): Payer: BLUE CROSS/BLUE SHIELD | Admitting: Obstetrics and Gynecology

## 2019-10-10 ENCOUNTER — Encounter: Payer: Self-pay | Admitting: Obstetrics and Gynecology

## 2019-10-10 VITALS — BP 137/78 | HR 82 | Ht 64.0 in | Wt 173.0 lb

## 2019-10-10 DIAGNOSIS — Z1239 Encounter for other screening for malignant neoplasm of breast: Secondary | ICD-10-CM | POA: Diagnosis not present

## 2019-10-10 DIAGNOSIS — Z3041 Encounter for surveillance of contraceptive pills: Secondary | ICD-10-CM

## 2019-10-10 DIAGNOSIS — Z01419 Encounter for gynecological examination (general) (routine) without abnormal findings: Secondary | ICD-10-CM

## 2019-10-10 NOTE — Progress Notes (Signed)
Gynecology Annual Exam  PCP: Duanne Limerick, MD  Chief Complaint:  Chief Complaint  Patient presents with  . Gynecologic Exam    History of Present Illness: Patient is a 44 y.o. G3P3003 presents for annual exam. The patient has no complaints today.   LMP: Patient's last menstrual period was 09/26/2019. Regular 28 days, flow normal.    The patient is sexually active. She currently uses OCP (estrogen/progesterone) for contraception. She denies dyspareunia.  The patient does perform self breast exams.  There is no notable family history of breast or ovarian cancer in her family.  The patient wears seatbelts: yes.   The patient has regular exercise: not asked.    The patient denies current symptoms of depression.    Review of Systems: Review of Systems  Constitutional: Negative for chills and fever.  HENT: Negative for congestion.   Respiratory: Negative for cough and shortness of breath.   Cardiovascular: Negative for chest pain and palpitations.  Gastrointestinal: Negative for abdominal pain, constipation, diarrhea, heartburn, nausea and vomiting.  Genitourinary: Negative for dysuria, frequency and urgency.  Skin: Negative for itching and rash.  Neurological: Negative for dizziness and headaches.  Endo/Heme/Allergies: Negative for polydipsia.  Psychiatric/Behavioral: Negative for depression.    Past Medical History:  Patient Active Problem List   Diagnosis Date Noted  . Current mild episode of major depressive disorder (HCC) 09/27/2017    Acute recurrent. PHQ-17. Will initiate sertraline 50mg  taper over 10 days. recheck in 4 weeks   . Screening for breast cancer 06/29/2016  . Blood pressure elevated without history of HTN 05/10/2016  . Asymptomatic PVCs 05/10/2016    Past Surgical History:  Past Surgical History:  Procedure Laterality Date  . AUGMENTATION MAMMAPLASTY Bilateral 2013  . BREAST SURGERY  2012   augmentation    Gynecologic History:  Patient's last  menstrual period was 09/26/2019. Contraception: OCP (estrogen/progesterone) Last Pap: Results were: 10/04/2018 NIL and HR HPV negative    Obstetric History: 10/06/2018  Family History:  Family History  Problem Relation Age of Onset  . Hypertension Mother   . Hypertension Father   . Diabetes Father   . Heart disease Paternal Grandmother   . Breast cancer Maternal Aunt   . Breast cancer Paternal Aunt     Social History:  Social History   Socioeconomic History  . Marital status: Married    Spouse name: Not on file  . Number of children: Not on file  . Years of education: Not on file  . Highest education level: Not on file  Occupational History  . Not on file  Tobacco Use  . Smoking status: Former Smoker    Types: Cigarettes  . Smokeless tobacco: Never Used  Vaping Use  . Vaping Use: Never used  Substance and Sexual Activity  . Alcohol use: Yes  . Drug use: No  . Sexual activity: Yes    Birth control/protection: Pill  Other Topics Concern  . Not on file  Social History Narrative  . Not on file   Social Determinants of Health   Financial Resource Strain:   . Difficulty of Paying Living Expenses: Not on file  Food Insecurity:   . Worried About [id number] in the Last Year: Not on file  . Ran Out of Food in the Last Year: Not on file  Transportation Needs:   . Lack of Transportation (Medical): Not on file  . Lack of Transportation (Non-Medical): Not on file  Physical Activity:   .  Days of Exercise per Week: Not on file  . Minutes of Exercise per Session: Not on file  Stress:   . Feeling of Stress : Not on file  Social Connections:   . Frequency of Communication with Friends and Family: Not on file  . Frequency of Social Gatherings with Friends and Family: Not on file  . Attends Religious Services: Not on file  . Active Member of Clubs or Organizations: Not on file  . Attends Banker Meetings: Not on file  . Marital Status: Not on file    Intimate Partner Violence:   . Fear of Current or Ex-Partner: Not on file  . Emotionally Abused: Not on file  . Physically Abused: Not on file  . Sexually Abused: Not on file    Allergies:  No Known Allergies  Medications: Prior to Admission medications   Medication Sig Start Date End Date Taking? Authorizing Provider  Levonorgest-Eth Est & Eth Est 42-21-21-7 DAYS TABS Take 1 tablet by mouth daily. 09/17/19  Yes Vena Austria, MD  lisinopril-hydrochlorothiazide (ZESTORETIC) 10-12.5 MG tablet Take 1 tablet by mouth daily. 06/22/19  Yes Duanne Limerick, MD    Physical Exam Vitals: Blood pressure 137/78, pulse 82, height 5\' 4"  (1.626 m), weight 173 lb (78.5 kg), last menstrual period 09/26/2019.  General: NAD HEENT: normocephalic, anicteric Thyroid: no enlargement, no palpable nodules Pulmonary: No increased work of breathing, CTAB Cardiovascular: RRR, distal pulses 2+ Breast: Breast symmetrical, no tenderness, no palpable nodules or masses, no skin or nipple retraction present, no nipple discharge.  No axillary or supraclavicular lymphadenopathy. Abdomen: NABS, soft, non-tender, non-distended.  Umbilicus without lesions.  No hepatomegaly, splenomegaly or masses palpable. No evidence of hernia  Genitourinary:  External: Normal external female genitalia.  Normal urethral meatus, normal Bartholin's and Skene's glands.    Vagina: Normal vaginal mucosa, no evidence of prolapse.    Cervix: Grossly normal in appearance, no bleeding  Uterus: Non-enlarged, mobile, normal contour.  No CMT  Adnexa: ovaries non-enlarged, no adnexal masses  Rectal: deferred  Lymphatic: no evidence of inguinal lymphadenopathy Extremities: no edema, erythema, or tenderness Neurologic: Grossly intact Psychiatric: mood appropriate, affect full  Female chaperone present for pelvic and breast  portions of the physical exam    Assessment: 44 y.o. G3P3003 routine annual exam  Plan: Problem List Items  Addressed This Visit    None    Visit Diagnoses    Encounter for gynecological examination without abnormal finding    -  Primary   Breast screening       Surveillance of previously prescribed contraceptive pill          1) Mammogram - recommend yearly screening mammogram.  Mammogram Was ordered today   2) STI screening  was notoffered and therefore not obtained  3) ASCCP guidelines and rational discussed.  Patient opts for every 3 years screening interval  4) Contraception - the patient is currently using  OCP (estrogen/progesterone).  She is happy with her current form of contraception and plans to continue  5) Colonoscopy -- Screening recommended starting at age 27 for average risk individuals  6) Routine healthcare maintenance including cholesterol, diabetes screening discussed managed by PCP  7) Return in about 1 year (around 10/09/2020) for annual.   10/11/2020, MD, Vena Austria OB/[facility], [facility] 10/10/2019, 2:40 PM

## 2019-11-29 ENCOUNTER — Telehealth: Payer: Self-pay | Admitting: Obstetrics and Gynecology

## 2019-11-29 ENCOUNTER — Other Ambulatory Visit: Payer: Self-pay | Admitting: Family

## 2019-11-29 DIAGNOSIS — U071 COVID-19: Secondary | ICD-10-CM

## 2019-11-29 NOTE — Telephone Encounter (Signed)
Patient is calling to follow up on message left. Patient aware AMS in [place] seeing patient's.

## 2019-11-29 NOTE — Telephone Encounter (Signed)
Patient is calling with  Positive covid test. Patient is requesting covid infusion. Please advise

## 2019-11-29 NOTE — Progress Notes (Signed)
I connected by phone with Joanna Huffman on 11/29/2019 at 8:02 PM to discuss the potential use of a new treatment for mild to moderate COVID-19 viral infection in non-hospitalized patients.  This patient is a 44 y.o. female that meets the FDA criteria for Emergency Use Authorization of COVID monoclonal antibody casirivimab/imdevimab, bamlanivimab/eteseviamb, or sotrovimab.  Has a (+) direct SARS-CoV-2 viral test result  Has mild or moderate COVID-19   Is NOT hospitalized due to COVID-19  Is within 10 days of symptom onset  Has at least one of the high risk factor(s) for progression to severe COVID-19 and/or hospitalization as defined in EUA.  Specific high risk criteria : BMI > 25 and Cardiovascular disease or hypertension   Symptoms of H/A and fever began 11/28/19.   I have spoken and communicated the following to the patient or parent/caregiver regarding COVID monoclonal antibody treatment:  1. FDA has authorized the emergency use for the treatment of mild to moderate COVID-19 in adults and pediatric patients with positive results of direct SARS-CoV-2 viral testing who are 59 years of age and older weighing at least 40 kg, and who are at high risk for progressing to severe COVID-19 and/or hospitalization.  2. The significant known and potential risks and benefits of COVID monoclonal antibody, and the extent to which such potential risks and benefits are unknown.  3. Information on available alternative treatments and the risks and benefits of those alternatives, including clinical trials.  4. Patients treated with COVID monoclonal antibody should continue to self-isolate and use infection control measures (e.g., wear mask, isolate, social distance, avoid sharing personal items, clean and disinfect "high touch" surfaces, and frequent handwashing) according to CDC guidelines.   5. The patient or parent/caregiver has the option to accept or refuse COVID monoclonal antibody  treatment.  After reviewing this information with the patient, the patient has agreed to receive one of the available covid 19 monoclonal antibodies and will be provided an appropriate fact sheet prior to infusion. Morton Stall, NP 11/29/2019 8:02 PM

## 2019-11-30 ENCOUNTER — Ambulatory Visit (HOSPITAL_COMMUNITY)
Admission: RE | Admit: 2019-11-30 | Discharge: 2019-11-30 | Disposition: A | Discharge status: Home / Self Care | Payer: BLUE CROSS/BLUE SHIELD | Source: Ambulatory Visit | Attending: Pulmonary Disease | Admitting: Pulmonary Disease

## 2019-11-30 DIAGNOSIS — U071 COVID-19: Secondary | ICD-10-CM | POA: Insufficient documentation

## 2019-11-30 MED ORDER — DIPHENHYDRAMINE HCL 50 MG/ML IJ SOLN: 50.0000 mg | Freq: Once | INTRAMUSCULAR | Status: DC | PRN

## 2019-11-30 MED ORDER — SOTROVIMAB 500 MG/8ML IV SOLN
500.0000 mg | Freq: Once | INTRAVENOUS | Status: AC
  Administered 2019-11-30: 500 mg via INTRAVENOUS

## 2019-11-30 MED ORDER — FAMOTIDINE IN NACL 20-0.9 MG/50ML-% IV SOLN: 20.0000 mg | Freq: Once | INTRAVENOUS | Status: DC | PRN

## 2019-11-30 MED ORDER — ALBUTEROL SULFATE HFA 108 (90 BASE) MCG/ACT IN AERS: 2.0000 | INHALATION_SPRAY | Freq: Once | RESPIRATORY_TRACT | Status: DC | PRN

## 2019-11-30 MED ORDER — SODIUM CHLORIDE 0.9 % IV SOLN: INTRAVENOUS | Status: DC | PRN

## 2019-11-30 MED ORDER — METHYLPREDNISOLONE SODIUM SUCC 125 MG IJ SOLR: 125.0000 mg | Freq: Once | INTRAMUSCULAR | Status: DC | PRN

## 2019-11-30 MED ORDER — EPINEPHRINE 0.3 MG/0.3ML IJ SOAJ: 0.3000 mg | Freq: Once | INTRAMUSCULAR | Status: DC | PRN

## 2019-11-30 NOTE — Discharge Instructions (Signed)

## 2019-11-30 NOTE — Progress Notes (Signed)
Diagnosis: COVID-19  Physician: Dr. Patrick Wright  Procedure: Covid Infusion Clinic Med: Sotrovimab infusion - Provided patient with sotrovimab fact sheet for patients, parents, and caregivers prior to infusion.   Complications: No immediate complications noted  Discharge: Discharged home    

## 2019-12-11 ENCOUNTER — Other Ambulatory Visit: Payer: Self-pay | Admitting: Obstetrics and Gynecology

## 2019-12-11 MED ORDER — LEVONORGEST-ETH EST & ETH EST 42-21-21-7 DAYS PO TABS
1.0000 | ORAL_TABLET | Freq: Every day | ORAL | 3 refills | Status: DC
Start: 2019-12-11 — End: 2021-01-05

## 2019-12-11 NOTE — Telephone Encounter (Signed)
Refilled 09/08/19 for 90 days had AE 10/09/19. Please send new RX for one yr.

## 2020-01-17 ENCOUNTER — Other Ambulatory Visit: Payer: Self-pay | Admitting: Family Medicine

## 2020-01-17 ENCOUNTER — Ambulatory Visit: Payer: BLUE CROSS/BLUE SHIELD | Admitting: Family Medicine

## 2020-01-17 DIAGNOSIS — I10 Essential (primary) hypertension: Secondary | ICD-10-CM

## 2020-01-21 ENCOUNTER — Ambulatory Visit: Payer: BLUE CROSS/BLUE SHIELD | Admitting: Family Medicine

## 2020-01-21 ENCOUNTER — Encounter: Payer: Self-pay | Admitting: Family Medicine

## 2020-01-21 ENCOUNTER — Other Ambulatory Visit: Payer: Self-pay

## 2020-01-21 VITALS — BP 120/80 | HR 68 | Ht 64.0 in | Wt 178.0 lb

## 2020-01-21 DIAGNOSIS — F410 Panic disorder [episodic paroxysmal anxiety] without agoraphobia: Secondary | ICD-10-CM | POA: Diagnosis not present

## 2020-01-21 DIAGNOSIS — I10 Essential (primary) hypertension: Secondary | ICD-10-CM | POA: Diagnosis not present

## 2020-01-21 DIAGNOSIS — J0100 Acute maxillary sinusitis, unspecified: Secondary | ICD-10-CM

## 2020-01-21 DIAGNOSIS — F411 Generalized anxiety disorder: Secondary | ICD-10-CM | POA: Diagnosis not present

## 2020-01-21 MED ORDER — LISINOPRIL-HYDROCHLOROTHIAZIDE 10-12.5 MG PO TABS: 1.0000 | ORAL_TABLET | Freq: Every day | ORAL | 1 refills | Status: DC

## 2020-01-21 MED ORDER — ALPRAZOLAM 0.25 MG PO TABS: 0.2500 mg | ORAL_TABLET | Freq: Two times a day (BID) | ORAL | 0 refills | Status: DC | PRN

## 2020-01-21 NOTE — Progress Notes (Addendum)
Date:  01/21/2020   Name:  Joanna Huffman   DOB:  1975/08/24   MRN:  [id number]   Chief Complaint: Hypertension and Anxiety (Wants some Xanax to get her through tragic events in her life)  Hypertension This is a chronic problem. The current episode started more than 1 year ago. The problem has been gradually improving since onset. The problem is controlled. Associated symptoms include anxiety. Pertinent negatives include no blurred vision, chest pain, headaches, malaise/fatigue, neck pain, orthopnea, palpitations, peripheral edema, PND, shortness of breath or sweats. There are no associated agents to hypertension. Risk factors for coronary artery disease include dyslipidemia. Past treatments include ACE inhibitors and diuretics. The current treatment provides moderate improvement. There are no compliance problems.  There is no history of angina, kidney disease, CAD/MI, CVA, heart failure, left ventricular hypertrophy, PVD or retinopathy. There is no history of chronic renal disease, a hypertension causing med or renovascular disease.  Anxiety Presents for follow-up visit. Symptoms include excessive worry and nervous/anxious behavior. Patient reports no chest pain, dizziness, nausea, palpitations or shortness of breath. Symptoms occur occasionally. The severity of symptoms is moderate. The quality of sleep is good.      Lab Results  Component Value Date   CREATININE 0.77 06/26/2018   BUN 15 06/26/2018   NA 136 06/26/2018   K 4.2 06/26/2018   CL 98 06/26/2018   CO2 23 06/26/2018   No results found for: CHOL, HDL, LDLCALC, LDLDIRECT, TRIG, CHOLHDL Lab Results  Component Value Date   TSH 1.340 10/04/2018   No results found for: HGBA1C No results found for: WBC, HGB, HCT, MCV, PLT No results found for: ALT, AST, GGT, ALKPHOS, BILITOT   Review of Systems  Constitutional: Negative.  Negative for chills, fatigue, fever, malaise/fatigue and unexpected weight change.  HENT: Negative for  congestion, ear discharge, ear pain, rhinorrhea, sinus pressure, sneezing and sore throat.   Eyes: Negative for blurred vision, double vision, photophobia, pain, discharge, redness and itching.  Respiratory: Negative for cough, shortness of breath, wheezing and stridor.   Cardiovascular: Negative for chest pain, palpitations, orthopnea and PND.  Gastrointestinal: Negative for abdominal pain, blood in stool, constipation, diarrhea, nausea and vomiting.  Endocrine: Negative for cold intolerance, heat intolerance, polydipsia, polyphagia and polyuria.  Genitourinary: Negative for dysuria, flank pain, frequency, hematuria, menstrual problem, pelvic pain, urgency, vaginal bleeding and vaginal discharge.  Musculoskeletal: Negative for arthralgias, back pain, myalgias and neck pain.  Skin: Negative for rash.  Allergic/Immunologic: Negative for environmental allergies and food allergies.  Neurological: Negative for dizziness, weakness, light-headedness, numbness and headaches.  Hematological: Negative for adenopathy. Does not bruise/bleed easily.  Psychiatric/Behavioral: Negative for dysphoric mood. The patient is nervous/anxious.     Patient Active Problem List   Diagnosis Date Noted  . Current mild episode of major depressive disorder (HCC) 09/27/2017  . Screening for breast cancer 06/29/2016  . Blood pressure elevated without history of HTN 05/10/2016  . Asymptomatic PVCs 05/10/2016    No Known Allergies  Past Surgical History:  Procedure Laterality Date  . AUGMENTATION MAMMAPLASTY Bilateral 2013  . BREAST SURGERY  2012   augmentation    Social History   Tobacco Use  . Smoking status: Former Smoker    Types: Cigarettes  . Smokeless tobacco: Never Used  Vaping Use  . Vaping Use: Never used  Substance Use Topics  . Alcohol use: Yes  . Drug use: No     Medication list has been reviewed and updated.  Current  Meds  Medication Sig  . Levonorgest-Eth Est & Eth Est 42-21-21-7 DAYS  TABS Take 1 tablet by mouth daily.  Marland Kitchen lisinopril-hydrochlorothiazide (ZESTORETIC) 10-12.5 MG tablet Take 1 tablet by mouth daily.    PHQ 2/9 Scores 01/21/2020 06/22/2019 06/26/2018 06/26/2018  PHQ - 2 Score 0 0 0 0  PHQ- 9 Score 0 0 - 0    GAD 7 : Generalized Anxiety Score 01/21/2020 06/22/2019  Nervous, Anxious, on Edge 0 0  Control/stop worrying 0 0  Worry too much - different things 0 0  Trouble relaxing 0 0  Restless 0 0  Easily annoyed or irritable 0 0  Afraid - awful might happen 0 0  Total GAD 7 Score 0 0  Anxiety Difficulty - Not difficult at all    BP Readings from Last 3 Encounters:  01/21/20 120/80  11/30/19 116/71  10/10/19 137/78    Physical Exam Vitals and nursing note reviewed.  Constitutional:      Appearance: She is well-developed and well-nourished.  HENT:     Head: Normocephalic.     Right Ear: Tympanic membrane, ear canal and external ear normal.     Left Ear: Tympanic membrane, ear canal and external ear normal.     Nose: Nose normal. No congestion or rhinorrhea.     Mouth/Throat:     Mouth: Oropharynx is clear and moist. Mucous membranes are moist.  Eyes:     General: Lids are everted, no foreign bodies appreciated. No scleral icterus.       Left eye: No foreign body or hordeolum.     Extraocular Movements: EOM normal.     Conjunctiva/sclera: Conjunctivae normal.     Right eye: Right conjunctiva is not injected.     Left eye: Left conjunctiva is not injected.     Pupils: Pupils are equal, round, and reactive to light.  Neck:     Thyroid: No thyromegaly.     Vascular: No JVD.     Trachea: No tracheal deviation.  Cardiovascular:     Rate and Rhythm: Normal rate and regular rhythm.     Pulses: Intact distal pulses.     Heart sounds: Normal heart sounds. No murmur heard. No friction rub. No gallop.   Pulmonary:     Effort: Pulmonary effort is normal. No respiratory distress.     Breath sounds: Normal breath sounds. No wheezing, rhonchi or rales.   Abdominal:     General: Bowel sounds are normal.     Palpations: Abdomen is soft. There is no hepatosplenomegaly or mass.     Tenderness: There is no abdominal tenderness. There is no guarding or rebound.  Musculoskeletal:        General: No tenderness or edema. Normal range of motion.     Cervical back: Normal range of motion and neck supple.  Lymphadenopathy:     Cervical: No cervical adenopathy.  Skin:    General: Skin is warm.     Capillary Refill: Capillary refill takes less than 2 seconds.     Findings: No rash.  Neurological:     Mental Status: She is alert and oriented to person, place, and time.     Cranial Nerves: No cranial nerve deficit.     Deep Tendon Reflexes: Strength normal. Reflexes normal.  Psychiatric:        Mood and Affect: Mood and affect normal. Mood is not anxious or depressed.     Wt Readings from Last 3 Encounters:  01/21/20 178 lb (80.7 kg)  10/10/19 173 lb (78.5 kg)  06/22/19 170 lb (77.1 kg)    BP 120/80   Pulse 68   Ht 5\' 4"  (1.626 m)   Wt 178 lb (80.7 kg)   BMI 30.55 kg/m   Assessment and Plan: 1. Essential hypertension Chronic.  Controlled.  Stable.  Blood pressure 120/80.  Patient will continue lisinopril hydrochlorothiazide 10-12 0.5 once a day.  Will check renal panel tomorrow and fasting state. - lisinopril-hydrochlorothiazide (ZESTORETIC) 10-12.5 MG tablet; Take 1 tablet by mouth daily.  Dispense: 90 tablet; Refill: 1  2. Generalized anxiety disorder with panic attacks New onset.  Persistent.  Episodic.  Patient has multiple circumstances creating anxiety provoking concerns without mentioning that she has an upcoming pregnancy/baby shower/husband and son taken trip in the middle of the winter.  This is resulted in panic attacks that we will treat with an occasional Xanax 0.25 mg on an as-needed basis. - ALPRAZolam (XANAX) 0.25 MG tablet; Take 1 tablet (0.25 mg total) by mouth 2 (two) times daily as needed for anxiety.  Dispense: 30  tablet; Refill: 0   3.  Acute maxillary sinusitis patient also notes that she has had productive nasal discharge which is slightly yellow with an exam that is consistent with maxillary sinusitis.  Will initiate Amoxil 500 mg 3 times daily for 10 days.

## 2020-01-21 NOTE — Patient Instructions (Signed)

## 2020-01-22 ENCOUNTER — Other Ambulatory Visit
Admission: RE | Admit: 2020-01-22 | Discharge: 2020-01-22 | Disposition: A | Discharge status: Home / Self Care | Payer: BLUE CROSS/BLUE SHIELD | Attending: Family Medicine | Admitting: Family Medicine

## 2020-01-22 ENCOUNTER — Encounter: Payer: Self-pay | Admitting: Family Medicine

## 2020-01-22 ENCOUNTER — Other Ambulatory Visit: Payer: Self-pay

## 2020-01-22 DIAGNOSIS — I10 Essential (primary) hypertension: Secondary | ICD-10-CM | POA: Diagnosis present

## 2020-01-22 DIAGNOSIS — J019 Acute sinusitis, unspecified: Secondary | ICD-10-CM

## 2020-01-22 LAB — RENAL FUNCTION PANEL
Albumin: 4 g/dL (ref 3.5–5.0)
Anion gap: 10 (ref 5–15)
BUN: 16 mg/dL (ref 6–20)
CO2: 25 mmol/L (ref 22–32)
Calcium: 9.4 mg/dL (ref 8.9–10.3)
Chloride: 96 mmol/L — ABNORMAL LOW (ref 98–111)
Creatinine, Ser: 0.88 mg/dL (ref 0.44–1.00)
GFR, Estimated: >60 mL/min (ref >60)
Glucose, Bld: 126 mg/dL — ABNORMAL HIGH (ref 70–99)
Phosphorus: 3.7 mg/dL (ref 2.5–4.6)
Potassium: 4 mmol/L (ref 3.5–5.1)
Sodium: 131 mmol/L — ABNORMAL LOW (ref 135–145)

## 2020-01-22 LAB — LIPID PANEL
Cholesterol: 164 mg/dL (ref 0–200)
HDL: 62 mg/dL (ref >40)
LDL Cholesterol: 80 mg/dL (ref 0–99)
Total CHOL/HDL Ratio: 2.6 RATIO
Triglycerides: 110 mg/dL (ref <150)
VLDL: 22 mg/dL (ref 0–40)

## 2020-01-22 MED ORDER — AMOXICILLIN 500 MG PO CAPS: 500.0000 mg | ORAL_CAPSULE | Freq: Three times a day (TID) | ORAL | 0 refills | Status: DC

## 2020-01-22 NOTE — Progress Notes (Unsigned)
Sent in amoxil

## 2020-05-10 ENCOUNTER — Encounter: Payer: Self-pay | Admitting: Obstetrics and Gynecology

## 2020-05-12 NOTE — Progress Notes (Unsigned)
Pap date entered

## 2020-07-16 ENCOUNTER — Other Ambulatory Visit: Payer: Self-pay | Admitting: Family Medicine

## 2020-07-16 DIAGNOSIS — I10 Essential (primary) hypertension: Secondary | ICD-10-CM

## 2020-12-25 ENCOUNTER — Other Ambulatory Visit: Payer: Self-pay | Admitting: Family Medicine

## 2020-12-25 DIAGNOSIS — I10 Essential (primary) hypertension: Secondary | ICD-10-CM

## 2020-12-25 NOTE — Telephone Encounter (Signed)
Requested medication (s) are due for refill today: yes  Requested medication (s) are on the active medication list: yes  Last refill:  10/12/20  Future visit scheduled: no  Notes to clinic:  failed protocol of visit within 6 months, labs within 180 days, labs from 01/2020, please assess.  Requested Prescriptions  Pending Prescriptions Disp Refills   lisinopril-hydrochlorothiazide (ZESTORETIC) 10-12.5 MG tablet [Pharmacy Med Name: LISINOPRIL-HCTZ 10-12.5 MG TAB] 90 tablet 1    Sig: TAKE 1 TABLET BY MOUTH EVERY DAY     Cardiovascular:  ACEI + Diuretic Combos Failed - 12/25/2020  3:21 PM      Failed - Na in normal range and within 180 days    Sodium  Date Value Ref Range Status  01/22/2020 131 (L) 135 - 145 mmol/L Final  06/26/2018 136 134 - 144 mmol/L Final          Failed - K in normal range and within 180 days    Potassium  Date Value Ref Range Status  01/22/2020 4.0 3.5 - 5.1 mmol/L Final          Failed - Cr in normal range and within 180 days    Creatinine, Ser  Date Value Ref Range Status  01/22/2020 0.88 0.44 - 1.00 mg/dL Final          Failed - Ca in normal range and within 180 days    Calcium  Date Value Ref Range Status  01/22/2020 9.4 8.9 - 10.3 mg/dL Final          Failed - Valid encounter within last 6 months    Recent Outpatient Visits           11 months ago Essential hypertension   [facility] Duanne Limerick, MD   1 year ago Essential hypertension   [facility] Duanne Limerick, MD   2 years ago Essential hypertension   [facility] Duanne Limerick, MD   2 years ago Essential hypertension   [facility] Duanne Limerick, MD   3 years ago Current mild episode of major depressive disorder, unspecified whether recurrent [facility])   [facility] Duanne Limerick, MD              Passed - Patient is not pregnant      Passed - Last BP in normal range    BP Readings from Last 1 Encounters:  01/21/20  120/80

## 2020-12-29 ENCOUNTER — Other Ambulatory Visit: Payer: Self-pay | Admitting: Obstetrics and Gynecology

## 2020-12-29 ENCOUNTER — Other Ambulatory Visit: Payer: Self-pay

## 2020-12-29 DIAGNOSIS — I10 Essential (primary) hypertension: Secondary | ICD-10-CM

## 2020-12-29 MED ORDER — LISINOPRIL-HYDROCHLOROTHIAZIDE 10-12.5 MG PO TABS: 1.0000 | ORAL_TABLET | Freq: Every day | ORAL | 0 refills | Status: DC

## 2020-12-29 NOTE — Telephone Encounter (Signed)
Please review.  KP

## 2021-01-05 MED ORDER — LEVONORGEST-ETH EST & ETH EST 42-21-21-7 DAYS PO TABS
1.0000 | ORAL_TABLET | Freq: Every day | ORAL | 0 refills | Status: DC
Start: 2021-01-05 — End: 2021-02-03

## 2021-01-05 NOTE — Telephone Encounter (Signed)
Pt calling for refill of bc; is completely out; has appt 02/03/21.  [phone]  Pt aware refill eRx'd.

## 2021-01-14 ENCOUNTER — Ambulatory Visit: Payer: Self-pay | Admitting: Family Medicine

## 2021-01-22 ENCOUNTER — Encounter: Payer: Self-pay | Admitting: Family Medicine

## 2021-01-22 ENCOUNTER — Other Ambulatory Visit: Payer: Self-pay

## 2021-01-22 ENCOUNTER — Ambulatory Visit (INDEPENDENT_AMBULATORY_CARE_PROVIDER_SITE_OTHER): Payer: Self-pay | Admitting: Family Medicine

## 2021-01-22 VITALS — BP 138/70 | HR 80 | Ht 64.0 in | Wt 174.0 lb

## 2021-01-22 DIAGNOSIS — I10 Essential (primary) hypertension: Secondary | ICD-10-CM

## 2021-01-22 MED ORDER — LISINOPRIL-HYDROCHLOROTHIAZIDE 10-12.5 MG PO TABS: 1.0000 | ORAL_TABLET | Freq: Every day | ORAL | 1 refills | Status: DC

## 2021-01-22 NOTE — Progress Notes (Signed)
Date:  01/22/2021   Name:  Joanna Huffman   DOB:  Jul 06, 1975   MRN:  [id number]   Chief Complaint: Hypertension  Hypertension This is a chronic problem. The current episode started more than 1 year ago. The problem has been gradually improving since onset. The problem is controlled. Pertinent negatives include no chest pain, headaches, orthopnea, palpitations, peripheral edema, PND or shortness of breath. Past treatments include ACE inhibitors and diuretics. The current treatment provides mild improvement. There are no compliance problems.  There is no history of angina, kidney disease, CAD/MI, CVA, heart failure, left ventricular hypertrophy, PVD or retinopathy. There is no history of chronic renal disease, a hypertension causing med or renovascular disease.   Lab Results  Component Value Date   NA 131 (L) 01/22/2020   K 4.0 01/22/2020   CO2 25 01/22/2020   GLUCOSE 126 (H) 01/22/2020   BUN 16 01/22/2020   CREATININE 0.88 01/22/2020   CALCIUM 9.4 01/22/2020   GFRNONAA >60 01/22/2020   Lab Results  Component Value Date   CHOL 164 01/22/2020   HDL 62 01/22/2020   LDLCALC 80 01/22/2020   TRIG 110 01/22/2020   CHOLHDL 2.6 01/22/2020   Lab Results  Component Value Date   TSH 1.340 10/04/2018   No results found for: HGBA1C No results found for: WBC, HGB, HCT, MCV, PLT No results found for: ALT, AST, GGT, ALKPHOS, BILITOT No results found for: 25OHVITD2, 25OHVITD3, VD25OH   Review of Systems  Constitutional: Negative.  Negative for chills, fatigue, fever and unexpected weight change.  HENT:  Negative for congestion, ear discharge, ear pain, rhinorrhea, sinus pressure, sneezing and sore throat.   Eyes:  Negative for photophobia, pain, discharge, redness and itching.  Respiratory:  Negative for cough, shortness of breath, wheezing and stridor.   Cardiovascular:  Negative for chest pain, palpitations, orthopnea and PND.  Gastrointestinal:  Negative for abdominal pain, blood in  stool, constipation, diarrhea, nausea and vomiting.  Endocrine: Negative for cold intolerance, heat intolerance, polydipsia, polyphagia and polyuria.  Genitourinary:  Negative for dysuria, flank pain, frequency, hematuria, menstrual problem, pelvic pain, urgency, vaginal bleeding and vaginal discharge.  Musculoskeletal:  Negative for arthralgias, back pain and myalgias.  Skin:  Negative for rash.  Allergic/Immunologic: Negative for environmental allergies and food allergies.  Neurological:  Negative for dizziness, weakness, light-headedness, numbness and headaches.  Hematological:  Negative for adenopathy. Does not bruise/bleed easily.  Psychiatric/Behavioral:  Negative for dysphoric mood. The patient is not nervous/anxious.    Patient Active Problem List   Diagnosis Date Noted   Current mild episode of major depressive disorder (HCC) 09/27/2017   Screening for breast cancer 06/29/2016   Blood pressure elevated without history of HTN 05/10/2016   Asymptomatic PVCs 05/10/2016    No Known Allergies  Past Surgical History:  Procedure Laterality Date   AUGMENTATION MAMMAPLASTY Bilateral 2013   BREAST SURGERY  2012   augmentation    Social History   Tobacco Use   Smoking status: Former    Types: Cigarettes   Smokeless tobacco: Never  Vaping Use   Vaping Use: Never used  Substance Use Topics   Alcohol use: Yes   Drug use: No     Medication list has been reviewed and updated.  Current Meds  Medication Sig   Levonorgest-Eth Est & Eth Est 42-21-21-7 DAYS TABS Take 1 tablet by mouth daily.   lisinopril-hydrochlorothiazide (ZESTORETIC) 10-12.5 MG tablet Take 1 tablet by mouth daily.    PHQ 2/9  Scores 01/22/2021 01/21/2020 06/22/2019 06/26/2018  PHQ - 2 Score 0 0 0 0  PHQ- 9 Score 0 0 0 -    GAD 7 : Generalized Anxiety Score 01/22/2021 01/21/2020 06/22/2019  Nervous, Anxious, on Edge 0 0 0  Control/stop worrying 0 0 0  Worry too much - different things 0 0 0  Trouble relaxing 0 0 0   Restless 0 0 0  Easily annoyed or irritable 0 0 0  Afraid - awful might happen 0 0 0  Total GAD 7 Score 0 0 0  Anxiety Difficulty Not difficult at all - Not difficult at all    BP Readings from Last 3 Encounters:  01/22/21 138/70  01/21/20 120/80  11/30/19 116/71    Physical Exam Vitals and nursing note reviewed. Exam conducted with a chaperone present.  Constitutional:      General: She is not in acute distress.    Appearance: She is not diaphoretic.  HENT:     Head: Normocephalic and atraumatic.     Right Ear: External ear normal.     Left Ear: External ear normal.     Nose: Nose normal. No congestion or rhinorrhea.  Eyes:     General:        Right eye: No discharge.        Left eye: No discharge.     Conjunctiva/sclera: Conjunctivae normal.     Pupils: Pupils are equal, round, and reactive to light.  Neck:     Thyroid: No thyromegaly.     Vascular: No JVD.  Cardiovascular:     Rate and Rhythm: Normal rate and regular rhythm.     Chest Wall: PMI is not displaced.     Heart sounds: Normal heart sounds, S1 normal and S2 normal. No murmur heard. No systolic murmur is present.  No diastolic murmur is present.    No friction rub. No gallop. No S3 or S4 sounds.  Pulmonary:     Effort: Pulmonary effort is normal.     Breath sounds: Normal breath sounds. No wheezing or rhonchi.  Abdominal:     General: Bowel sounds are normal.     Palpations: Abdomen is soft. There is no mass.     Tenderness: There is no abdominal tenderness. There is no guarding.  Musculoskeletal:        General: Normal range of motion.     Cervical back: Normal range of motion and neck supple.  Lymphadenopathy:     Cervical: No cervical adenopathy.  Skin:    General: Skin is warm and dry.  Neurological:     Mental Status: She is alert.     Deep Tendon Reflexes: Reflexes are normal and symmetric.    Wt Readings from Last 3 Encounters:  01/22/21 174 lb (78.9 kg)  01/21/20 178 lb (80.7 kg)   10/10/19 173 lb (78.5 kg)    BP 138/70    Pulse 80    Ht 5\' 4"  (1.626 m)    Wt 174 lb (78.9 kg)    LMP 12/22/2020 (Approximate)    BMI 29.87 kg/m   Assessment and Plan:  1. Essential hypertension Chronic.  Controlled.  Stable.  Blood pressure today is 138/70.  Continue lisinopril hydrochlorothiazide 10-12.5 mg once a day.  We will recheck patient in 6 months. - lisinopril-hydrochlorothiazide (ZESTORETIC) 10-12.5 MG tablet; Take 1 tablet by mouth daily.  Dispense: 90 tablet; Refill: 1

## 2021-02-03 ENCOUNTER — Other Ambulatory Visit: Payer: Self-pay

## 2021-02-03 ENCOUNTER — Ambulatory Visit (INDEPENDENT_AMBULATORY_CARE_PROVIDER_SITE_OTHER): Payer: Self-pay | Admitting: Obstetrics and Gynecology

## 2021-02-03 ENCOUNTER — Encounter: Payer: Self-pay | Admitting: Obstetrics and Gynecology

## 2021-02-03 ENCOUNTER — Other Ambulatory Visit (HOSPITAL_COMMUNITY)
Admission: RE | Admit: 2021-02-03 | Discharge: 2021-02-03 | Disposition: A | Discharge status: Home / Self Care | Payer: 59 | Source: Ambulatory Visit | Attending: Obstetrics and Gynecology | Admitting: Obstetrics and Gynecology

## 2021-02-03 VITALS — BP 120/80 | Ht 63.5 in | Wt 179.0 lb

## 2021-02-03 DIAGNOSIS — Z124 Encounter for screening for malignant neoplasm of cervix: Secondary | ICD-10-CM | POA: Diagnosis not present

## 2021-02-03 DIAGNOSIS — Z01419 Encounter for gynecological examination (general) (routine) without abnormal findings: Secondary | ICD-10-CM

## 2021-02-03 DIAGNOSIS — Z1239 Encounter for other screening for malignant neoplasm of breast: Secondary | ICD-10-CM

## 2021-02-03 DIAGNOSIS — Z3041 Encounter for surveillance of contraceptive pills: Secondary | ICD-10-CM

## 2021-02-03 DIAGNOSIS — I10 Essential (primary) hypertension: Secondary | ICD-10-CM | POA: Diagnosis not present

## 2021-02-03 LAB — HM PAP SMEAR

## 2021-02-03 MED ORDER — LEVONORGEST-ETH EST & ETH EST 42-21-21-7 DAYS PO TABS: 1.0000 | ORAL_TABLET | Freq: Every day | ORAL | 3 refills | Status: DC

## 2021-02-03 NOTE — Progress Notes (Signed)
Gynecology Annual Exam  PCP: Duanne Limerick, MD  Chief Complaint:  Chief Complaint  Patient presents with   Annual Exam    History of Present Illness: Patient is a 46 y.o. Joanna Huffman presents for annual exam. The patient has no complaints today.   LMP: Patient's last menstrual period was 12/18/2020. Average Interval: regular, 28 days Duration of flow: 5 days Heavy Menses: no Clots: no Intermenstrual Bleeding: no Postcoital Bleeding: no Dysmenorrhea: no   The patient is sexually active. She currently uses OCP (estrogen/progesterone) for contraception. She denies dyspareunia.  The patient does perform self breast exams.  There is no notable family history of breast or ovarian cancer in her family.  The patient wears seatbelts: yes.   The patient has regular exercise: not asked.    The patient denies current symptoms of depression.    Review of Systems: Review of Systems  Constitutional:  Negative for chills and fever.  HENT:  Negative for congestion.   Respiratory:  Negative for cough and shortness of breath.   Cardiovascular:  Negative for chest pain and palpitations.  Gastrointestinal:  Negative for abdominal pain, constipation, diarrhea, heartburn, nausea and vomiting.  Genitourinary:  Negative for dysuria, frequency and urgency.  Skin:  Negative for itching and rash.  Neurological:  Negative for dizziness and headaches.  Endo/Heme/Allergies:  Negative for polydipsia.  Psychiatric/Behavioral:  Negative for depression.    Past Medical History:  Patient Active Problem List   Diagnosis Date Noted   Current mild episode of major depressive disorder (HCC) 09/27/2017    Acute recurrent. PHQ-17. Will initiate sertraline 50mg  taper over 10 days. recheck in 4 weeks    Screening for breast cancer 06/29/2016   Blood pressure elevated without history of HTN 05/10/2016   Asymptomatic PVCs 05/10/2016    Past Surgical History:  Past Surgical History:  Procedure Laterality  Date   AUGMENTATION MAMMAPLASTY Bilateral 2013   BREAST SURGERY  2012   augmentation    Gynecologic History:  Patient's last menstrual period was 12/18/2020. Contraception: OCP (estrogen/progesterone)  Obstetric History: 14/01/2020  Family History:  Family History  Problem Relation Age of Onset   Hypertension Mother    Hypertension Father    Diabetes Father    Heart disease Paternal Grandmother    Breast cancer Maternal Aunt    Breast cancer Paternal Aunt     Social History:  Social History   Socioeconomic History   Marital status: Married    Spouse name: Not on file   Number of children: Not on file   Years of education: Not on file   Highest education level: Not on file  Occupational History   Not on file  Tobacco Use   Smoking status: Former    Types: Cigarettes   Smokeless tobacco: Never  Vaping Use   Vaping Use: Never used  Substance and Sexual Activity   Alcohol use: Yes   Drug use: No   Sexual activity: Yes    Birth control/protection: Pill  Other Topics Concern   Not on file  Social History Narrative   Not on file   Social Determinants of Health   Financial Resource Strain: Not on file  Food Insecurity: Not on file  Transportation Needs: Not on file  Physical Activity: Not on file  Stress: Not on file  Social Connections: Not on file  Intimate Partner Violence: Not on file    Allergies:  No Known Allergies  Medications: Prior to Admission medications   Medication Sig  Start Date End Date Taking? Authorizing Provider  ALPRAZolam (XANAX) 0.25 MG tablet Take 1 tablet (0.25 mg total) by mouth 2 (two) times daily as needed for anxiety. 01/21/20  Yes Duanne Limerick, MD  Levonorgest-Eth Est & Eth Est 42-21-21-7 DAYS TABS Take 1 tablet by mouth daily. 01/05/21  Yes Vena Austria, MD  lisinopril-hydrochlorothiazide (ZESTORETIC) 10-12.5 MG tablet Take 1 tablet by mouth daily. 01/22/21  Yes Duanne Limerick, MD    Physical Exam Vitals: Blood pressure  120/80, height 5' 3.5" (1.613 m), weight 179 lb (81.2 kg), last menstrual period 12/18/2020.  General: NAD HEENT: normocephalic, anicteric Thyroid: no enlargement, no palpable nodules Pulmonary: No increased work of breathing, CTAB Cardiovascular: RRR, distal pulses 2+ Breast: Breast symmetrical, no tenderness, no palpable nodules or masses, no skin or nipple retraction present, no nipple discharge.  No axillary or supraclavicular lymphadenopathy. Abdomen: NABS, soft, non-tender, non-distended.  Umbilicus without lesions.  No hepatomegaly, splenomegaly or masses palpable. No evidence of hernia  Genitourinary:  External: Normal external female genitalia.  Normal urethral meatus, normal Bartholin's and Skene's glands.    Vagina: Normal vaginal mucosa, no evidence of prolapse.    Cervix: Grossly normal in appearance, no bleeding  Uterus: Non-enlarged, mobile, normal contour.  No CMT  Adnexa: ovaries non-enlarged, no adnexal masses  Rectal: deferred  Lymphatic: no evidence of inguinal lymphadenopathy Extremities: no edema, erythema, or tenderness Neurologic: Grossly intact Psychiatric: mood appropriate, affect full  Female chaperone present for pelvic and breast  portions of the physical exam    Assessment: 46 y.o. [id number] routine annual exam  Plan: Problem List Items Addressed This Visit   None   1) Mammogram - recommend yearly screening mammogram.  Mammogram Was ordered today   2) STI screening  was notoffered and therefore not obtained  3) ASCCP guidelines and rational discussed.  Patient opts for every 3 years screening interval  4) Contraception - the patient is currently using  OCP (estrogen/progesterone).  She is happy with her current form of contraception and plans to continue - discussed contraindication to estrogen with HTN, patient normotensive today.  Opts to continue OCP as opposed to progestin only option  5) Colonoscopy -- Screening recommended starting at age 16  for average risk individuals, age 78 for individuals deemed at increased risk (including African Americans) and recommended to continue until age 84.  For patient age 35-85 individualized approach is recommended.  Gold standard screening is via colonoscopy, Cologuard screening is an acceptable alternative for patient unwilling or unable to undergo colonoscopy.  "Colorectal cancer screening for average?risk adults: 2018 guideline update from the American Cancer Society"CA: A Cancer Journal for Clinicians: Jun 16, 2016   6) Routine healthcare maintenance including cholesterol, diabetes screening discussed managed by PCP  7) No follow-ups on file.   Vena Austria, MD, [facility], [facility] 02/03/2021, 2:45 PM

## 2021-02-03 NOTE — Patient Instructions (Signed)
[facility] [address] [place] [address]  [facility]  [address] [place] [address]  Phone: [phone]  

## 2021-02-04 LAB — RENAL FUNCTION PANEL
Albumin: 4.3 g/dL (ref 3.8–4.8)
BUN/Creatinine Ratio: 19 (ref 9–23)
BUN: 19 mg/dL (ref 6–24)
CO2: 23 mmol/L (ref 20–29)
Calcium: 9.7 mg/dL (ref 8.7–10.2)
Chloride: 99 mmol/L (ref 96–106)
Creatinine, Ser: 1.02 mg/dL — ABNORMAL HIGH (ref 0.57–1.00)
Glucose: 88 mg/dL (ref 70–99)
Phosphorus: 3.8 mg/dL (ref 3.0–4.3)
Potassium: 4.1 mmol/L (ref 3.5–5.2)
Sodium: 137 mmol/L (ref 134–144)
eGFR: 68 mL/min/{1.73_m2} (ref >59)

## 2021-02-04 LAB — LIPID PANEL WITH LDL/HDL RATIO
Cholesterol, Total: 171 mg/dL (ref 100–199)
HDL: 64 mg/dL (ref >39)
LDL Chol Calc (NIH): 90 mg/dL (ref 0–99)
LDL/HDL Ratio: 1.4 ratio (ref 0.0–3.2)
Triglycerides: 94 mg/dL (ref 0–149)
VLDL Cholesterol Cal: 17 mg/dL (ref 5–40)

## 2021-02-09 LAB — CYTOLOGY - PAP
Comment: NEGATIVE
Diagnosis: NEGATIVE
High risk HPV: NEGATIVE

## 2021-02-14 ENCOUNTER — Other Ambulatory Visit: Payer: Self-pay | Admitting: Obstetrics and Gynecology

## 2021-02-17 ENCOUNTER — Other Ambulatory Visit: Payer: Self-pay | Admitting: Family Medicine

## 2021-02-17 DIAGNOSIS — I10 Essential (primary) hypertension: Secondary | ICD-10-CM

## 2021-04-06 ENCOUNTER — Other Ambulatory Visit: Payer: Self-pay | Admitting: Family Medicine

## 2021-04-06 DIAGNOSIS — F411 Generalized anxiety disorder: Secondary | ICD-10-CM

## 2021-04-08 ENCOUNTER — Other Ambulatory Visit: Payer: Self-pay

## 2021-04-08 NOTE — Telephone Encounter (Signed)
Pt calling for ref of bc; pharm needs to hear from [place].  [phone]  Called [facility] [place] and spoke c Rachael who states their app isn't working; pt needs to call to request refill; pt does have refills on file thru Jan 2024; adv to get rx ready for pt p/u.  Pt aware of all of this. ?

## 2021-07-27 ENCOUNTER — Telehealth: Payer: Self-pay | Admitting: Family Medicine

## 2021-07-27 NOTE — Telephone Encounter (Signed)
Copied from CRM [id number]. Topic: Appointment Scheduling - Scheduling Inquiry for Clinic >> Jul 27, 2021  3:55 PM Everette C wrote: Reason for CRM: The patient would like to discuss their newly cancelled 6 month follow up with Delice Bison when possible   Please contact further when available

## 2021-07-30 ENCOUNTER — Ambulatory Visit: Payer: Self-pay | Admitting: Family Medicine

## 2021-08-03 ENCOUNTER — Other Ambulatory Visit: Payer: Self-pay | Admitting: Family Medicine

## 2021-08-03 DIAGNOSIS — I10 Essential (primary) hypertension: Secondary | ICD-10-CM

## 2021-08-12 ENCOUNTER — Encounter: Payer: Self-pay | Admitting: Family Medicine

## 2021-08-12 ENCOUNTER — Ambulatory Visit (INDEPENDENT_AMBULATORY_CARE_PROVIDER_SITE_OTHER): Payer: 59 | Admitting: Family Medicine

## 2021-08-12 VITALS — BP 120/70 | HR 60 | Ht 63.5 in | Wt 168.0 lb

## 2021-08-12 DIAGNOSIS — F410 Panic disorder [episodic paroxysmal anxiety] without agoraphobia: Secondary | ICD-10-CM

## 2021-08-12 DIAGNOSIS — F411 Generalized anxiety disorder: Secondary | ICD-10-CM

## 2021-08-12 DIAGNOSIS — R69 Illness, unspecified: Secondary | ICD-10-CM | POA: Diagnosis not present

## 2021-08-12 DIAGNOSIS — I10 Essential (primary) hypertension: Secondary | ICD-10-CM

## 2021-08-12 MED ORDER — ALPRAZOLAM 0.25 MG PO TABS: 0.2500 mg | ORAL_TABLET | Freq: Two times a day (BID) | ORAL | 0 refills | Status: DC | PRN

## 2021-08-12 MED ORDER — LISINOPRIL-HYDROCHLOROTHIAZIDE 10-12.5 MG PO TABS: 1.0000 | ORAL_TABLET | Freq: Every day | ORAL | 1 refills | Status: DC

## 2021-08-12 NOTE — Patient Instructions (Signed)

## 2021-08-12 NOTE — Progress Notes (Signed)
Date:  08/12/2021   Name:  Joanna Huffman   DOB:  [birth year]   MRN:  [id number]   Chief Complaint: Hypertension and Anxiety  Hypertension This is a chronic problem. The current episode started more than 1 year ago. The problem has been waxing and waning since onset. The problem is controlled. Associated symptoms include anxiety. Pertinent negatives include no blurred vision, chest pain, headaches, malaise/fatigue, neck pain, orthopnea, palpitations, peripheral edema, PND, shortness of breath or sweats. There are no associated agents to hypertension. There are no known risk factors for coronary artery disease. Past treatments include ACE inhibitors and diuretics. The current treatment provides moderate improvement. There are no compliance problems.  There is no history of angina, CAD/MI, CVA, heart failure or left ventricular hypertrophy. There is no history of chronic renal disease, a hypertension causing med or renovascular disease.  Anxiety Presents for follow-up visit. Patient reports no chest pain, decreased concentration, dizziness, excessive worry, irritability, nausea, nervous/anxious behavior, palpitations, panic, restlessness, shortness of breath or suicidal ideas.      Lab Results  Component Value Date   NA 137 02/03/2021   K 4.1 02/03/2021   CO2 23 02/03/2021   GLUCOSE 88 02/03/2021   BUN 19 02/03/2021   CREATININE 1.02 (H) 02/03/2021   CALCIUM 9.7 02/03/2021   EGFR 68 02/03/2021   GFRNONAA >60 01/22/2020   Lab Results  Component Value Date   CHOL 171 02/03/2021   HDL 64 02/03/2021   LDLCALC 90 02/03/2021   TRIG 94 02/03/2021   CHOLHDL 2.6 01/22/2020   Lab Results  Component Value Date   TSH 1.340 10/04/2018   No results found for: "HGBA1C" No results found for: "WBC", "HGB", "HCT", "MCV", "PLT" No results found for: "ALT", "AST", "GGT", "ALKPHOS", "BILITOT" No results found for: "25OHVITD2", "25OHVITD3", "VD25OH"   Review of Systems  Constitutional:  Negative  for chills, fever, irritability and malaise/fatigue.  HENT:  Negative for drooling, ear discharge, ear pain and sore throat.   Eyes:  Negative for blurred vision.  Respiratory:  Negative for cough, shortness of breath and wheezing.   Cardiovascular:  Negative for chest pain, palpitations, orthopnea, leg swelling and PND.  Gastrointestinal:  Negative for abdominal pain, blood in stool, constipation, diarrhea and nausea.  Endocrine: Negative for polydipsia.  Genitourinary:  Negative for dysuria, frequency, hematuria and urgency.  Musculoskeletal:  Negative for back pain, myalgias and neck pain.  Skin:  Negative for rash.  Allergic/Immunologic: Negative for environmental allergies.  Neurological:  Negative for dizziness and headaches.  Hematological:  Does not bruise/bleed easily.  Psychiatric/Behavioral:  Negative for decreased concentration and suicidal ideas. The patient is not nervous/anxious.     Patient Active Problem List   Diagnosis Date Noted   Current mild episode of major depressive disorder ([place]) 09/27/2017   Screening for breast cancer 06/29/2016   Blood pressure elevated without history of HTN 05/10/2016   Asymptomatic PVCs 05/10/2016    No Known Allergies  Past Surgical History:  Procedure Laterality Date   AUGMENTATION MAMMAPLASTY Bilateral 2013   BREAST SURGERY  2012   augmentation    Social History   Tobacco Use   Smoking status: Former    Types: Cigarettes   Smokeless tobacco: Never  Vaping Use   Vaping Use: Never used  Substance Use Topics   Alcohol use: Yes   Drug use: No     Medication list has been reviewed and updated.  Current Meds  Medication Sig   ALPRAZolam Duanne Moron)  0.25 MG tablet Take 1 tablet (0.25 mg total) by mouth 2 (two) times daily as needed for anxiety.   Levonorgest-Eth Est & Eth Est 42-21-21-7 DAYS TABS Take 1 tablet by mouth daily.   lisinopril-hydrochlorothiazide (ZESTORETIC) 10-12.5 MG tablet Take 1 tablet by mouth daily.        08/12/2021    9:15 AM 01/22/2021    8:05 AM 01/21/2020    4:12 PM 06/22/2019    1:32 PM  GAD 7 : Generalized Anxiety Score  Nervous, Anxious, on Edge 0 0 0 0  Control/stop worrying 0 0 0 0  Worry too much - different things 0 0 0 0  Trouble relaxing 0 0 0 0  Restless 0 0 0 0  Easily annoyed or irritable 0 0 0 0  Afraid - awful might happen 0 0 0 0  Total GAD 7 Score 0 0 0 0  Anxiety Difficulty Not difficult at all Not difficult at all  Not difficult at all       08/12/2021    9:15 AM 01/22/2021    8:05 AM 01/21/2020    4:11 PM  Depression screen PHQ 2/9  Decreased Interest 0 0 0  Down, Depressed, Hopeless 0 0 0  PHQ - 2 Score 0 0 0  Altered sleeping 0 0 0  Tired, decreased energy 0 0 0  Change in appetite 0 0 0  Feeling bad or failure about yourself  0 0 0  Trouble concentrating 0 0 0  Moving slowly or fidgety/restless 0 0 0  Suicidal thoughts 0 0 0  PHQ-9 Score 0 0 0  Difficult doing work/chores Not difficult at all Not difficult at all     BP Readings from Last 3 Encounters:  08/12/21 120/70  02/03/21 120/80  01/22/21 138/70    Physical Exam Vitals and nursing note reviewed.  Constitutional:      Appearance: She is well-developed.  HENT:     Head: Normocephalic.     Right Ear: Tympanic membrane, ear canal and external ear normal.     Left Ear: Tympanic membrane, ear canal and external ear normal.     Mouth/Throat:     Mouth: Mucous membranes are moist.  Eyes:     General: Lids are everted, no foreign bodies appreciated. No scleral icterus.       Left eye: No foreign body or hordeolum.     Conjunctiva/sclera: Conjunctivae normal.     Right eye: Right conjunctiva is not injected.     Left eye: Left conjunctiva is not injected.     Pupils: Pupils are equal, round, and reactive to light.  Neck:     Thyroid: No thyromegaly.     Vascular: No JVD.     Trachea: No tracheal deviation.  Cardiovascular:     Rate and Rhythm: Normal rate and regular rhythm.      Heart sounds: Normal heart sounds. No murmur heard.    No friction rub. No gallop.  Pulmonary:     Effort: Pulmonary effort is normal. No respiratory distress.     Breath sounds: Normal breath sounds. No wheezing, rhonchi or rales.  Abdominal:     General: Bowel sounds are normal.     Palpations: Abdomen is soft. There is no mass.     Tenderness: There is no abdominal tenderness. There is no guarding or rebound.  Musculoskeletal:        General: No tenderness. Normal range of motion.     Cervical back: Normal  range of motion and neck supple.  Lymphadenopathy:     Cervical: No cervical adenopathy.  Skin:    General: Skin is warm.     Findings: No rash.  Neurological:     Mental Status: She is alert.  Psychiatric:        Mood and Affect: Mood is not anxious or depressed.     Wt Readings from Last 3 Encounters:  08/12/21 168 lb (76.2 kg)  02/03/21 179 lb (81.2 kg)  01/22/21 174 lb (78.9 kg)    BP 120/70   Pulse 60   Ht 5' 3.5" (1.613 m)   Wt 168 lb (76.2 kg)   BMI 29.29 kg/m   Assessment and Plan:

## 2021-08-13 LAB — COMPREHENSIVE METABOLIC PANEL
ALT: 13 IU/L (ref 0–32)
AST: 17 IU/L (ref 0–40)
Albumin/Globulin Ratio: 1.9 (ref 1.2–2.2)
Albumin: 4.6 g/dL (ref 3.9–4.9)
Alkaline Phosphatase: 53 IU/L (ref 44–121)
BUN/Creatinine Ratio: 15 (ref 9–23)
BUN: 14 mg/dL (ref 6–24)
Bilirubin Total: 0.3 mg/dL (ref 0.0–1.2)
CO2: 24 mmol/L (ref 20–29)
Calcium: 10.4 mg/dL — ABNORMAL HIGH (ref 8.7–10.2)
Chloride: 99 mmol/L (ref 96–106)
Creatinine, Ser: 0.92 mg/dL (ref 0.57–1.00)
Globulin, Total: 2.4 g/dL (ref 1.5–4.5)
Glucose: 86 mg/dL (ref 70–99)
Potassium: 5 mmol/L (ref 3.5–5.2)
Sodium: 138 mmol/L (ref 134–144)
Total Protein: 7 g/dL (ref 6.0–8.5)
eGFR: 77 mL/min/{1.73_m2} (ref >59)

## 2021-10-28 DIAGNOSIS — M170 Bilateral primary osteoarthritis of knee: Secondary | ICD-10-CM | POA: Diagnosis not present

## 2021-10-28 DIAGNOSIS — G8929 Other chronic pain: Secondary | ICD-10-CM | POA: Diagnosis not present

## 2021-10-28 DIAGNOSIS — M1711 Unilateral primary osteoarthritis, right knee: Secondary | ICD-10-CM | POA: Diagnosis not present

## 2021-10-28 DIAGNOSIS — M25562 Pain in left knee: Secondary | ICD-10-CM | POA: Diagnosis not present

## 2022-02-07 ENCOUNTER — Other Ambulatory Visit: Payer: Self-pay | Admitting: Family Medicine

## 2022-02-07 DIAGNOSIS — I10 Essential (primary) hypertension: Secondary | ICD-10-CM

## 2022-02-12 ENCOUNTER — Ambulatory Visit (INDEPENDENT_AMBULATORY_CARE_PROVIDER_SITE_OTHER): Payer: 59 | Admitting: Family Medicine

## 2022-02-12 ENCOUNTER — Encounter: Payer: Self-pay | Admitting: Family Medicine

## 2022-02-12 VITALS — BP 110/64 | HR 71 | Ht 63.5 in | Wt 169.0 lb

## 2022-02-12 DIAGNOSIS — Z1211 Encounter for screening for malignant neoplasm of colon: Secondary | ICD-10-CM

## 2022-02-12 DIAGNOSIS — I10 Essential (primary) hypertension: Secondary | ICD-10-CM | POA: Diagnosis not present

## 2022-02-12 MED ORDER — LISINOPRIL-HYDROCHLOROTHIAZIDE 10-12.5 MG PO TABS: 1.0000 | ORAL_TABLET | Freq: Every day | ORAL | 1 refills | Status: DC

## 2022-02-12 NOTE — Progress Notes (Signed)
Date:  02/12/2022   Name:  Joanna Huffman   DOB:  November 22, 1975   MRN:  [id number]   Chief Complaint: Hypertension  Hypertension This is a chronic problem. The current episode started more than 1 year ago. The problem has been gradually improving since onset. The problem is controlled. Pertinent negatives include no chest pain, palpitations or shortness of breath. There are no associated agents to hypertension. Past treatments include ACE inhibitors and diuretics. The current treatment provides significant improvement. There are no compliance problems.     Lab Results  Component Value Date   NA 138 08/12/2021   K 5.0 08/12/2021   CO2 24 08/12/2021   GLUCOSE 86 08/12/2021   BUN 14 08/12/2021   CREATININE 0.92 08/12/2021   CALCIUM 10.4 (H) 08/12/2021   EGFR 77 08/12/2021   GFRNONAA >60 01/22/2020   Lab Results  Component Value Date   CHOL 171 02/03/2021   HDL 64 02/03/2021   LDLCALC 90 02/03/2021   TRIG 94 02/03/2021   CHOLHDL 2.6 01/22/2020   Lab Results  Component Value Date   TSH 1.340 10/04/2018   No results found for: "HGBA1C" No results found for: "WBC", "HGB", "HCT", "MCV", "PLT" Lab Results  Component Value Date   ALT 13 08/12/2021   AST 17 08/12/2021   ALKPHOS 53 08/12/2021   BILITOT 0.3 08/12/2021   No results found for: "25OHVITD2", "25OHVITD3", "VD25OH"   Review of Systems  Constitutional:  Negative for fatigue and unexpected weight change.  HENT:  Negative for trouble swallowing.   Respiratory:  Negative for shortness of breath.   Cardiovascular:  Negative for chest pain and palpitations.  Gastrointestinal:  Negative for abdominal pain, blood in stool, constipation and diarrhea.  Endocrine: Negative for polydipsia and polyuria.    Patient Active Problem List   Diagnosis Date Noted   Current mild episode of major depressive disorder ([place]) 09/27/2017   Screening for breast cancer 06/29/2016   Blood pressure elevated without history of HTN 05/10/2016    Asymptomatic PVCs 05/10/2016    No Known Allergies  Past Surgical History:  Procedure Laterality Date   AUGMENTATION MAMMAPLASTY Bilateral 2013   BREAST SURGERY  2012   augmentation    Social History   Tobacco Use   Smoking status: Former    Types: Cigarettes   Smokeless tobacco: Never  Vaping Use   Vaping Use: Never used  Substance Use Topics   Alcohol use: Yes   Drug use: No     Medication list has been reviewed and updated.  Current Meds  Medication Sig   ALPRAZolam (XANAX) 0.25 MG tablet Take 1 tablet (0.25 mg total) by mouth 2 (two) times daily as needed for anxiety.   Levonorgest-Eth Est & Eth Est 42-21-21-7 DAYS TABS Take 1 tablet by mouth daily.   [DISCONTINUED] lisinopril-hydrochlorothiazide (ZESTORETIC) 10-12.5 MG tablet TAKE 1 TABLET BY MOUTH EVERY DAY       02/12/2022    8:56 AM 08/12/2021    9:15 AM 01/22/2021    8:05 AM 01/21/2020    4:12 PM  GAD 7 : Generalized Anxiety Score  Nervous, Anxious, on Edge 0 0 0 0  Control/stop worrying 0 0 0 0  Worry too much - different things 0 0 0 0  Trouble relaxing 0 0 0 0  Restless 0 0 0 0  Easily annoyed or irritable 0 0 0 0  Afraid - awful might happen 0 0 0 0  Total GAD 7 Score 0  0 0 0  Anxiety Difficulty Not difficult at all Not difficult at all Not difficult at all        02/12/2022    8:56 AM 08/12/2021    9:15 AM 01/22/2021    8:05 AM  Depression screen PHQ 2/9  Decreased Interest 0 0 0  Down, Depressed, Hopeless 0 0 0  PHQ - 2 Score 0 0 0  Altered sleeping 0 0 0  Tired, decreased energy 0 0 0  Change in appetite 0 0 0  Feeling bad or failure about yourself  0 0 0  Trouble concentrating 0 0 0  Moving slowly or fidgety/restless 0 0 0  Suicidal thoughts 0 0 0  PHQ-9 Score 0 0 0  Difficult doing work/chores Not difficult at all Not difficult at all Not difficult at all    BP Readings from Last 3 Encounters:  02/12/22 110/64  08/12/21 120/70  02/03/21 120/80    Physical Exam Vitals and  nursing note reviewed. Exam conducted with a chaperone present.  Constitutional:      General: She is not in acute distress.    Appearance: She is not diaphoretic.  HENT:     Head: Normocephalic and atraumatic.     Right Ear: Tympanic membrane and external ear normal.     Left Ear: Tympanic membrane and external ear normal.     Nose: Nose normal.     Mouth/Throat:     Mouth: Mucous membranes are moist.     Pharynx: No oropharyngeal exudate or posterior oropharyngeal erythema.  Eyes:     General:        Right eye: No discharge.        Left eye: No discharge.     Conjunctiva/sclera: Conjunctivae normal.     Pupils: Pupils are equal, round, and reactive to light.  Neck:     Thyroid: No thyromegaly.     Vascular: No JVD.  Cardiovascular:     Rate and Rhythm: Normal rate and regular rhythm.     Heart sounds: Normal heart sounds. No murmur heard.    No friction rub. No gallop.  Pulmonary:     Effort: Pulmonary effort is normal.     Breath sounds: Normal breath sounds. No wheezing, rhonchi or rales.  Abdominal:     General: Bowel sounds are normal.     Palpations: Abdomen is soft. There is no mass.     Tenderness: There is no abdominal tenderness. There is no guarding or rebound.  Musculoskeletal:        General: Normal range of motion.     Cervical back: Normal range of motion and neck supple.  Lymphadenopathy:     Cervical: No cervical adenopathy.  Skin:    General: Skin is warm and dry.  Neurological:     Mental Status: She is alert.     Deep Tendon Reflexes: Reflexes are normal and symmetric.     Wt Readings from Last 3 Encounters:  02/12/22 169 lb (76.7 kg)  08/12/21 168 lb (76.2 kg)  02/03/21 179 lb (81.2 kg)    BP 110/64   Pulse 71   Ht 5' 3.5" (1.613 m)   Wt 169 lb (76.7 kg)   SpO2 97%   BMI 29.47 kg/m   Assessment and Plan:  1. Essential hypertension Chronic.  Controlled.  Stable.  Blood pressure 110/64.  Continue lisinopril hydrochlorothiazide 10-12.5  mg once a day.  Will check renal function panel to evaluate for mildly elevated calcium  last visit.  Will recheck patient in 6 months. - lisinopril-hydrochlorothiazide (ZESTORETIC) 10-12.5 MG tablet; Take 1 tablet by mouth daily.  Dispense: 90 tablet; Refill: 1 - Renal Function Panel  2. Colon cancer screening Discussed with patient and she would like to initiate colon cancer screening if allowed by insurance. - Ambulatory referral to Gastroenterology    Elizabeth Sauer, MD

## 2022-02-13 LAB — RENAL FUNCTION PANEL
Albumin: 4.4 g/dL (ref 3.9–4.9)
BUN/Creatinine Ratio: 9 (ref 9–23)
BUN: 8 mg/dL (ref 6–24)
CO2: 25 mmol/L (ref 20–29)
Calcium: 10.3 mg/dL — ABNORMAL HIGH (ref 8.7–10.2)
Chloride: 98 mmol/L (ref 96–106)
Creatinine, Ser: 0.87 mg/dL (ref 0.57–1.00)
Glucose: 89 mg/dL (ref 70–99)
Phosphorus: 3.1 mg/dL (ref 3.0–4.3)
Potassium: 4.8 mmol/L (ref 3.5–5.2)
Sodium: 136 mmol/L (ref 134–144)
eGFR: 82 mL/min/{1.73_m2} (ref >59)

## 2022-02-15 ENCOUNTER — Telehealth: Payer: Self-pay

## 2022-02-15 ENCOUNTER — Other Ambulatory Visit: Payer: Self-pay

## 2022-02-15 DIAGNOSIS — Z1211 Encounter for screening for malignant neoplasm of colon: Secondary | ICD-10-CM

## 2022-02-15 MED ORDER — NA SULFATE-K SULFATE-MG SULF 17.5-3.13-1.6 GM/177ML PO SOLN: 1.0000 | Freq: Once | ORAL | 0 refills | Status: AC

## 2022-02-15 NOTE — Telephone Encounter (Signed)
Gastroenterology Pre-Procedure Review  Request Date: 03/08/22 Requesting Physician: Dr. Allen Norris  PATIENT REVIEW QUESTIONS: The patient responded to the following health history questions as indicated:    1. Are you having any GI issues? no 2. Do you have a personal history of Polyps? no 3. Do you have a family history of Colon Cancer or Polyps? no 4. Diabetes Mellitus? no 5. Joint replacements in the past 12 months?no 6. Major health problems in the past 3 months?no 7. Any artificial heart valves, MVP, or defibrillator?no    MEDICATIONS & ALLERGIES:    Patient reports the following regarding taking any anticoagulation/antiplatelet therapy:   Plavix, Coumadin, Eliquis, Xarelto, Lovenox, Pradaxa, Brilinta, or Effient? no Aspirin? no  Patient confirms/reports the following medications:  Current Outpatient Medications  Medication Sig Dispense Refill   ALPRAZolam (XANAX) 0.25 MG tablet Take 1 tablet (0.25 mg total) by mouth 2 (two) times daily as needed for anxiety. 30 tablet 0   Levonorgest-Eth Est & Eth Est 42-21-21-7 DAYS TABS Take 1 tablet by mouth daily. 84 tablet 3   lisinopril-hydrochlorothiazide (ZESTORETIC) 10-12.5 MG tablet Take 1 tablet by mouth daily. 90 tablet 1   No current facility-administered medications for this visit.    Patient confirms/reports the following allergies:  No Known Allergies  No orders of the defined types were placed in this encounter.   AUTHORIZATION INFORMATION Primary Insurance: 1D#: Group #:  Secondary Insurance: 1D#: Group #:  SCHEDULE INFORMATION: Date: 03/08/22 Time: Location: [place]

## 2022-02-17 ENCOUNTER — Other Ambulatory Visit: Payer: Self-pay

## 2022-02-17 DIAGNOSIS — Z1211 Encounter for screening for malignant neoplasm of colon: Secondary | ICD-10-CM

## 2022-02-17 LAB — HEMOCCULT GUIAC POC 1CARD (OFFICE)
Card #2 Fecal Occult Blod, POC: NEGATIVE
Card #3 Fecal Occult Blood, POC: POSITIVE
Fecal Occult Blood, POC: NEGATIVE

## 2022-02-17 NOTE — Progress Notes (Unsigned)
1 out of 3 positive hemoccults.

## 2022-02-26 ENCOUNTER — Other Ambulatory Visit: Payer: Self-pay | Admitting: Family Medicine

## 2022-02-26 DIAGNOSIS — F410 Panic disorder [episodic paroxysmal anxiety] without agoraphobia: Secondary | ICD-10-CM

## 2022-03-02 ENCOUNTER — Other Ambulatory Visit: Payer: Self-pay | Admitting: Family Medicine

## 2022-03-02 DIAGNOSIS — Z1231 Encounter for screening mammogram for malignant neoplasm of breast: Secondary | ICD-10-CM

## 2022-03-02 DIAGNOSIS — F410 Panic disorder [episodic paroxysmal anxiety] without agoraphobia: Secondary | ICD-10-CM

## 2022-03-03 ENCOUNTER — Encounter: Payer: Self-pay | Admitting: Gastroenterology

## 2022-03-03 ENCOUNTER — Other Ambulatory Visit: Payer: Self-pay

## 2022-03-05 MED ORDER — ALPRAZOLAM 0.25 MG PO TABS: 0.2500 mg | ORAL_TABLET | Freq: Every evening | ORAL | 0 refills | Status: DC | PRN

## 2022-03-05 NOTE — Anesthesia Preprocedure Evaluation (Signed)
Anesthesia Evaluation  Patient identified by MRN, date of birth, ID band Patient awake    Reviewed: Allergy & Precautions, H&P , NPO status , Patient's Chart, lab work & pertinent test results  Airway Mallampati: II  TM Distance: >3 FB Neck ROM: full    Dental no notable dental hx.    Pulmonary neg pulmonary ROS, former smoker   Pulmonary exam normal        Cardiovascular hypertension, Normal cardiovascular exam     Neuro/Psych  PSYCHIATRIC DISORDERS  Depression    negative neurological ROS     GI/Hepatic negative GI ROS, Neg liver ROS,,,  Endo/Other  negative endocrine ROS    Renal/GU negative Renal ROS  negative genitourinary   Musculoskeletal  (+) Arthritis ,    Abdominal   Peds  Hematology negative hematology ROS (+)   Anesthesia Other Findings Past Medical History: No date: Arthritis     Comment:  L4-5, knees, maybe hands No date: Dysrhythmia     Comment:  Pt states occasional pounding heart No date: Hypertension No date: Seasonal allergies  Past Surgical History: 2013: AUGMENTATION MAMMAPLASTY; Bilateral 2012: BREAST SURGERY     Comment:  augmentation  BMI    Body Mass Index: 28.77 kg/m      Reproductive/Obstetrics negative OB ROS                             Anesthesia Physical Anesthesia Plan  ASA: 2  Anesthesia Plan: General   Post-op Pain Management:    Induction:   PONV Risk Score and Plan: Propofol infusion and TIVA  Airway Management Planned:   Additional Equipment:   Intra-op Plan:   Post-operative Plan:   Informed Consent:      Dental Advisory Given  Plan Discussed with: CRNA and Surgeon  Anesthesia Plan Comments:         Anesthesia Quick Evaluation

## 2022-03-08 ENCOUNTER — Ambulatory Visit
Admission: RE | Admit: 2022-03-08 | Discharge: 2022-03-08 | Disposition: A | Discharge status: Home / Self Care | Payer: 59 | Attending: Gastroenterology | Admitting: Gastroenterology

## 2022-03-08 ENCOUNTER — Ambulatory Visit: Payer: 59 | Admitting: Anesthesiology

## 2022-03-08 ENCOUNTER — Encounter
Admission: RE | Disposition: A | Discharge status: Home / Self Care | Payer: Self-pay | Source: Home / Self Care | Attending: Gastroenterology

## 2022-03-08 ENCOUNTER — Other Ambulatory Visit: Payer: Self-pay

## 2022-03-08 ENCOUNTER — Encounter: Payer: Self-pay | Admitting: Gastroenterology

## 2022-03-08 DIAGNOSIS — Z1211 Encounter for screening for malignant neoplasm of colon: Secondary | ICD-10-CM | POA: Diagnosis not present

## 2022-03-08 DIAGNOSIS — M1990 Unspecified osteoarthritis, unspecified site: Secondary | ICD-10-CM | POA: Diagnosis not present

## 2022-03-08 DIAGNOSIS — Z87891 Personal history of nicotine dependence: Secondary | ICD-10-CM | POA: Insufficient documentation

## 2022-03-08 DIAGNOSIS — I10 Essential (primary) hypertension: Secondary | ICD-10-CM | POA: Diagnosis not present

## 2022-03-08 HISTORY — DX: Unspecified osteoarthritis, unspecified site: M19.90

## 2022-03-08 HISTORY — DX: Other seasonal allergic rhinitis: J30.2

## 2022-03-08 HISTORY — PX: COLONOSCOPY WITH PROPOFOL: SHX5780

## 2022-03-08 HISTORY — DX: Cardiac arrhythmia, unspecified: I49.9

## 2022-03-08 LAB — POCT PREGNANCY, URINE: Preg Test, Ur: NEGATIVE

## 2022-03-08 SURGERY — COLONOSCOPY WITH PROPOFOL
Anesthesia: General | Site: Rectum

## 2022-03-08 MED ORDER — SODIUM CHLORIDE 0.9 % IV SOLN: INTRAVENOUS | Status: DC

## 2022-03-08 MED ORDER — STERILE WATER FOR IRRIGATION IR SOLN
Status: DC | PRN
  Administered 2022-03-08: 100 mL

## 2022-03-08 MED ORDER — LACTATED RINGERS IV SOLN: INTRAVENOUS | Status: DC

## 2022-03-08 MED ORDER — PROPOFOL 10 MG/ML IV BOLUS
INTRAVENOUS | Status: DC | PRN
  Administered 2022-03-08: 30 mg via INTRAVENOUS
  Administered 2022-03-08: 100 mg via INTRAVENOUS

## 2022-03-08 MED ORDER — PROPOFOL 500 MG/50ML IV EMUL
INTRAVENOUS | Status: DC | PRN
  Administered 2022-03-08: 140 ug/kg/min via INTRAVENOUS

## 2022-03-08 MED ORDER — LIDOCAINE HCL (CARDIAC) PF 100 MG/5ML IV SOSY
PREFILLED_SYRINGE | INTRAVENOUS | Status: DC | PRN
  Administered 2022-03-08: 20 mg via INTRAVENOUS

## 2022-03-08 SURGICAL SUPPLY — 5 items
GOWN CVR UNV OPN BCK APRN NK (MISCELLANEOUS) ×2 IMPLANT
GOWN ISOL THUMB LOOP REG UNIV (MISCELLANEOUS) ×2
KIT PRC NS LF DISP ENDO (KITS) ×1 IMPLANT
KIT PROCEDURE OLYMPUS (KITS) ×1
MANIFOLD NEPTUNE II (INSTRUMENTS) ×1 IMPLANT

## 2022-03-08 NOTE — Op Note (Signed)
[facility] Gastroenterology Patient Name: Joanna Huffman Procedure Date: 03/08/2022 8:28 AM MRN: [id number] Account #: [id number] Date of Birth: January 30, 1975 Admit Type: Outpatient Age: 47 Room: [facility] OR ROOM 01 Gender: Female Note Status: Finalized Instrument Name: [id number] Procedure:             Colonoscopy Indications:           Screening for colorectal malignant neoplasm Providers:             [name] MD, MD Referring MD:          Juline Patch, MD (Referring MD) Medicines:             Propofol per Anesthesia Complications:         No immediate complications. Procedure:             Pre-Anesthesia Assessment:                        - Prior to the procedure, a History and Physical was                         performed, and patient medications and allergies were                         reviewed. The patient's tolerance of previous                         anesthesia was also reviewed. The risks and benefits                         of the procedure and the sedation options and risks                         were discussed with the patient. All questions were                         answered, and informed consent was obtained. Prior                         Anticoagulants: The patient has taken no anticoagulant                         or antiplatelet agents. ASA Grade Assessment: II - A                         patient with mild systemic disease. After reviewing                         the risks and benefits, the patient was deemed in                         satisfactory condition to undergo the procedure.                        After obtaining informed consent, the colonoscope was                         passed under direct vision. Throughout the procedure,  the patient's blood pressure, pulse, and oxygen                         saturations were monitored continuously. The                         Colonoscope was introduced through the anus and                          advanced to the the cecum, identified by appendiceal                         orifice and ileocecal valve. The colonoscopy was                         performed without difficulty. The patient tolerated                         the procedure well. The quality of the bowel                         preparation was excellent. Findings:      The perianal and digital rectal examinations were normal.      The entire examined colon appeared normal. Impression:            - The entire examined colon is normal.                        - No specimens collected. Recommendation:        - Discharge patient to home.                        - Resume previous diet.                        - Continue present medications.                        - Repeat colonoscopy in 10 years for screening                         purposes. Procedure Code(s):     --- Professional ---                        [phone], Colonoscopy, flexible; diagnostic, including                         collection of specimen(s) by brushing or washing, when                         performed (separate procedure) Diagnosis Code(s):     --- Professional ---                        Z12.11, Encounter for screening for malignant neoplasm                         of colon CPT copyright 2022 [employer]. All rights reserved. The codes documented in this report are preliminary and upon coder review may  be revised  to meet current compliance requirements. [name] MD, MD 03/08/2022 8:48:42 AM This report has been signed electronically. Number of Addenda: 0 Note Initiated On: 03/08/2022 8:28 AM Scope Withdrawal Time: 0 hours 6 minutes 45 seconds  Total Procedure Duration: 0 hours 12 minutes 0 seconds  Estimated Blood Loss:  Estimated blood loss: none.      [facility]

## 2022-03-08 NOTE — Transfer of Care (Signed)
Immediate Anesthesia Transfer of Care Note  Patient: Joanna Huffman  Procedure(s) Performed: COLONOSCOPY WITH PROPOFOL (Rectum)  Patient Location: PACU  Anesthesia Type: General  Level of Consciousness: awake, alert  and patient cooperative  Airway and Oxygen Therapy: Patient Spontanous Breathing and Patient connected to supplemental oxygen  Post-op Assessment: Post-op Vital signs reviewed, Patient's Cardiovascular Status Stable, Respiratory Function Stable, Patent Airway and No signs of Nausea or vomiting  Post-op Vital Signs: Reviewed and stable  Complications: No notable events documented.

## 2022-03-08 NOTE — Progress Notes (Signed)
EKG performed due to low HR per anesthesia order.  No new orders received after completed. Anesthesia okay for discharge.

## 2022-03-08 NOTE — Anesthesia Postprocedure Evaluation (Signed)
Anesthesia Post Note  Patient: Joanna Huffman  Procedure(s) Performed: COLONOSCOPY WITH PROPOFOL (Rectum)  Patient location during evaluation: PACU Anesthesia Type: General Level of consciousness: awake and alert Pain management: pain level controlled Vital Signs Assessment: post-procedure vital signs reviewed and stable Respiratory status: spontaneous breathing, nonlabored ventilation and respiratory function stable Cardiovascular status: blood pressure returned to baseline, stable and bradycardic Postop Assessment: no apparent nausea or vomiting Anesthetic complications: no Comments: Sinus bradycardia occurred in pacu with stable hemodynamics. Pt was asymptomatic. This was discussed with the patient.    No notable events documented.   Last Vitals:  Vitals:   03/08/22 0744 03/08/22 0857  BP: 139/82   Pulse: 69   Resp: 15   Temp: 36.9 C 36.7 C  SpO2: 99%     Last Pain:  Vitals:   03/08/22 0905  TempSrc:   PainSc: 0-No pain                 [place] Ouch

## 2022-03-08 NOTE — H&P (Signed)
Joanna Lame, MD [facility] [address] [place], [place] [address] Phone: [phone] Fax : [phone]  Primary Care Physician:  Juline Patch, MD Primary Gastroenterologist:  Dr. Allen Norris  Pre-Procedure History & Physical: HPI:  Joanna Huffman is a 47 y.o. female is here for a screening colonoscopy.   Past Medical History:  Diagnosis Date   Arthritis    L4-5, knees, maybe hands   Dysrhythmia    Pt states occasional pounding heart   Hypertension    Seasonal allergies     Past Surgical History:  Procedure Laterality Date   AUGMENTATION MAMMAPLASTY Bilateral 2013   BREAST SURGERY  2012   augmentation    Prior to Admission medications   Medication Sig Start Date End Date Taking? Authorizing Provider  ALPRAZolam (XANAX) 0.25 MG tablet Take 1 tablet (0.25 mg total) by mouth at bedtime as needed for anxiety. 03/05/22  Yes Juline Patch, MD  Levonorgest-Eth Est & Eth Est 42-21-21-7 DAYS TABS Take 1 tablet by mouth daily. 02/03/21  Yes Malachy Mood, MD  lisinopril-hydrochlorothiazide (ZESTORETIC) 10-12.5 MG tablet Take 1 tablet by mouth daily. 02/12/22  Yes Juline Patch, MD  Multiple Vitamins-Minerals (MULTIVITAMIN WOMEN) TABS Take by mouth.   Yes [provider]  Omega-3 Fatty Acids (FISH OIL TRIPLE STRENGTH) 1400 MG CAPS Take by mouth.   Yes [provider]  Probiotic Product (PROBIOTIC BLEND PO) Take by mouth.   Yes [provider]  Vitamin D, Cholecalciferol, 25 MCG (1000 UT) TABS Take by mouth.   Yes [provider]    Allergies as of 02/15/2022   (No Known Allergies)    Family History  Problem Relation Age of Onset   Hypertension Mother    Hypertension Father    Diabetes Father    Heart disease Paternal Grandmother    Breast cancer Maternal Aunt    Breast cancer Paternal Aunt     Social History   Socioeconomic History   Marital status: Married    Spouse name: Not on file   Number of children: Not on file    Years of education: Not on file   Highest education level: Not on file  Occupational History   Not on file  Tobacco Use   Smoking status: Former    Packs/day: 0.25    Years: 5.00    Total pack years: 1.25    Types: Cigarettes   Smokeless tobacco: Never  Vaping Use   Vaping Use: Never used  Substance and Sexual Activity   Alcohol use: Yes    Comment: occasionally   Drug use: No   Sexual activity: Yes    Birth control/protection: Pill  Other Topics Concern   Not on file  Social History Narrative   Not on file   Social Determinants of Health   Financial Resource Strain: Not on file  Food Insecurity: Not on file  Transportation Needs: Not on file  Physical Activity: Not on file  Stress: Not on file  Social Connections: Not on file  Intimate Partner Violence: Not on file    Review of Systems: See HPI, otherwise negative ROS  Physical Exam: BP 139/82   Pulse 69   Temp 98.5 F (36.9 C) (Temporal)   Resp 15   Ht 5' 3.5" (1.613 m)   Wt 75.3 kg   SpO2 99%   BMI 28.94 kg/m  General:   Alert,  pleasant and cooperative in NAD Head:  Normocephalic and atraumatic. Neck:  Supple; no masses  or thyromegaly. Lungs:  Clear throughout to auscultation.    Heart:  Regular rate and rhythm. Abdomen:  Soft, nontender and nondistended. Normal bowel sounds, without guarding, and without rebound.   Neurologic:  Alert and  oriented x4;  grossly normal neurologically.  Impression/Plan: Joanna Huffman is now here to undergo a screening colonoscopy.  Risks, benefits, and alternatives regarding colonoscopy have been reviewed with the patient.  Questions have been answered.  All parties agreeable.

## 2022-03-09 ENCOUNTER — Encounter: Payer: Self-pay | Admitting: Gastroenterology

## 2022-03-25 ENCOUNTER — Ambulatory Visit
Admission: RE | Admit: 2022-03-25 | Discharge: 2022-03-25 | Disposition: A | Discharge status: Home / Self Care | Payer: 59 | Source: Ambulatory Visit | Attending: Family Medicine | Admitting: Family Medicine

## 2022-03-25 DIAGNOSIS — Z1231 Encounter for screening mammogram for malignant neoplasm of breast: Secondary | ICD-10-CM | POA: Insufficient documentation

## 2022-03-30 ENCOUNTER — Other Ambulatory Visit: Payer: Self-pay

## 2022-03-30 ENCOUNTER — Telehealth: Payer: Self-pay

## 2022-03-30 MED ORDER — LEVONORGEST-ETH EST & ETH EST 42-21-21-7 DAYS PO TABS: 1.0000 | ORAL_TABLET | Freq: Every day | ORAL | 0 refills | Status: DC

## 2022-03-30 NOTE — Telephone Encounter (Signed)
[facility] sent over a prescription request. It has been denied due too patient needs an appointment.

## 2022-04-13 ENCOUNTER — Ambulatory Visit: Payer: 59 | Admitting: Family Medicine

## 2022-04-21 ENCOUNTER — Encounter: Payer: Self-pay | Admitting: Obstetrics and Gynecology

## 2022-04-21 ENCOUNTER — Ambulatory Visit (INDEPENDENT_AMBULATORY_CARE_PROVIDER_SITE_OTHER): Payer: 59 | Admitting: Obstetrics and Gynecology

## 2022-04-21 VITALS — BP 105/72 | HR 74 | Ht 63.5 in | Wt 163.6 lb

## 2022-04-21 DIAGNOSIS — Z3041 Encounter for surveillance of contraceptive pills: Secondary | ICD-10-CM

## 2022-04-21 DIAGNOSIS — Z01419 Encounter for gynecological examination (general) (routine) without abnormal findings: Secondary | ICD-10-CM | POA: Diagnosis not present

## 2022-04-21 DIAGNOSIS — Z3009 Encounter for other general counseling and advice on contraception: Secondary | ICD-10-CM

## 2022-04-21 MED ORDER — LEVONORGEST-ETH EST & ETH EST 42-21-21-7 DAYS PO TABS: 1.0000 | ORAL_TABLET | Freq: Every day | ORAL | 3 refills | Status: AC

## 2022-04-21 NOTE — Progress Notes (Signed)
Patients presents for annual exam today. She states she enjoys her current OCP, refilled. Patient is up to date on pap smear and mammogram. She states no other questions or concerns at this time.

## 2022-04-21 NOTE — Progress Notes (Signed)
HPI:      [name] is a 47 y.o. [phone] who LMP was Patient's last menstrual period was 03/28/2022 (approximate).  Subjective:   She presents today for her annual examination.  She is taking OCPs and having normal regular cycles.  She is not missing pills.  She is happy on OCPs and would like to continue them.  She just had a negative mammogram.  She has no complaints.  She is up-to-date on her Pap smear.    Hx: The following portions of the patient's history were reviewed and updated as appropriate:             She  has a past medical history of Arthritis, Dysrhythmia, Hypertension, and Seasonal allergies. She does not have any pertinent problems on file. She  has a past surgical history that includes Breast surgery (2012); Augmentation mammaplasty (Bilateral, 2013); and Colonoscopy with propofol (N/A, 03/08/2022). Her family history includes Breast cancer in her maternal aunt and paternal aunt; Diabetes in her father; Heart disease in her paternal grandmother; Hypertension in her father and mother. She  reports that she has quit smoking. Her smoking use included cigarettes. She has a 1.25 pack-year smoking history. She has never used smokeless tobacco. She reports current alcohol use. She reports that she does not use drugs. She has a current medication list which includes the following prescription(s): alprazolam, lisinopril-hydrochlorothiazide, multivitamin women, fish oil triple strength, probiotic product, and levonorgest-eth est & eth est. She has No Known Allergies.       Review of Systems:  Review of Systems  Constitutional: Denied constitutional symptoms, night sweats, recent illness, fatigue, fever, insomnia and weight loss.  Eyes: Denied eye symptoms, eye pain, photophobia, vision change and visual disturbance.  Ears/Nose/Throat/Neck: Denied ear, nose, throat or neck symptoms, hearing loss, nasal discharge, sinus congestion and sore throat.  Cardiovascular: Denied  cardiovascular symptoms, arrhythmia, chest pain/pressure, edema, exercise intolerance, orthopnea and palpitations.  Respiratory: Denied pulmonary symptoms, asthma, pleuritic pain, productive sputum, cough, dyspnea and wheezing.  Gastrointestinal: Denied, gastro-esophageal reflux, melena, nausea and vomiting.  Genitourinary: Denied genitourinary symptoms including symptomatic vaginal discharge, pelvic relaxation issues, and urinary complaints.  Musculoskeletal: Denied musculoskeletal symptoms, stiffness, swelling, muscle weakness and myalgia.  Dermatologic: Denied dermatology symptoms, rash and scar.  Neurologic: Denied neurology symptoms, dizziness, headache, neck pain and syncope.  Psychiatric: Denied psychiatric symptoms, anxiety and depression.  Endocrine: Denied endocrine symptoms including hot flashes and night sweats.   Meds:   Current Outpatient Medications on File Prior to Visit  Medication Sig Dispense Refill   ALPRAZolam (XANAX) 0.25 MG tablet Take 1 tablet (0.25 mg total) by mouth at bedtime as needed for anxiety. 20 tablet 0   lisinopril-hydrochlorothiazide (ZESTORETIC) 10-12.5 MG tablet Take 1 tablet by mouth daily. 90 tablet 1   Multiple Vitamins-Minerals (MULTIVITAMIN WOMEN) TABS Take by mouth.     Omega-3 Fatty Acids (FISH OIL TRIPLE STRENGTH) 1400 MG CAPS Take by mouth.     Probiotic Product (PROBIOTIC BLEND PO) Take by mouth.     No current facility-administered medications on file prior to visit.     Objective:     Vitals:   04/21/22 1341  BP: 105/72  Pulse: 74    Filed Weights   04/21/22 1341  Weight: 163 lb 9.6 oz (74.2 kg)              Patient declines examination today she is up-to-date on mammography and Pap smear.  Assessment:    [id number] Patient Active Problem  List   Diagnosis Date Noted   Encounter for screening colonoscopy 03/08/2022   Current mild episode of major depressive disorder 09/27/2017   Screening for breast cancer 06/29/2016   Blood  pressure elevated without history of HTN 05/10/2016   Asymptomatic PVCs 05/10/2016     1. Well woman exam with routine gynecological exam   2. Birth control counseling   3. Oral contraceptive pill surveillance     Happy on OCPs would like to continue   Plan:            1.  Basic Screening Recommendations The basic screening recommendations for asymptomatic women were discussed with the patient during her visit.  The age-appropriate recommendations were discussed with her and the rational for the tests reviewed.  When I am informed by the patient that another primary care physician has previously obtained the age-appropriate tests and they are up-to-date, only outstanding tests are ordered and referrals given as necessary.  Abnormal results of tests will be discussed with her when all of her results are completed.  Routine preventative health maintenance measures emphasized: Exercise/Diet/Weight control, Tobacco Warnings, Alcohol/Substance use risks and Stress Management 2.  Continue OCPs Orders No orders of the defined types were placed in this encounter.    Meds ordered this encounter  Medications   Levonorgest-Eth Est & Eth Est 42-21-21-7 DAYS TABS    Sig: Take 1 tablet by mouth daily.    Dispense:  90 tablet    Refill:  3    Please not correction on refills to 3           F/U  Return in about 1 year (around 04/21/2023) for Annual Physical.  Finis Bud, M.D. 04/21/2022 2:16 PM

## 2022-06-03 DIAGNOSIS — I499 Cardiac arrhythmia, unspecified: Secondary | ICD-10-CM | POA: Diagnosis not present

## 2022-06-03 DIAGNOSIS — Z8249 Family history of ischemic heart disease and other diseases of the circulatory system: Secondary | ICD-10-CM | POA: Diagnosis not present

## 2022-06-03 DIAGNOSIS — Z8261 Family history of arthritis: Secondary | ICD-10-CM | POA: Diagnosis not present

## 2022-06-03 DIAGNOSIS — Z791 Long term (current) use of non-steroidal anti-inflammatories (NSAID): Secondary | ICD-10-CM | POA: Diagnosis not present

## 2022-06-03 DIAGNOSIS — Z87891 Personal history of nicotine dependence: Secondary | ICD-10-CM | POA: Diagnosis not present

## 2022-06-03 DIAGNOSIS — N946 Dysmenorrhea, unspecified: Secondary | ICD-10-CM | POA: Diagnosis not present

## 2022-06-03 DIAGNOSIS — I10 Essential (primary) hypertension: Secondary | ICD-10-CM | POA: Diagnosis not present

## 2022-06-03 DIAGNOSIS — Z793 Long term (current) use of hormonal contraceptives: Secondary | ICD-10-CM | POA: Diagnosis not present

## 2022-06-03 DIAGNOSIS — M1711 Unilateral primary osteoarthritis, right knee: Secondary | ICD-10-CM | POA: Diagnosis not present

## 2022-06-03 DIAGNOSIS — F411 Generalized anxiety disorder: Secondary | ICD-10-CM | POA: Diagnosis not present

## 2022-08-13 ENCOUNTER — Ambulatory Visit: Payer: 59 | Admitting: Family Medicine

## 2022-08-16 ENCOUNTER — Encounter: Payer: Self-pay | Admitting: Family Medicine

## 2022-08-16 ENCOUNTER — Ambulatory Visit: Payer: 59 | Admitting: Family Medicine

## 2022-08-16 VITALS — BP 120/70 | HR 65 | Ht 63.5 in | Wt 168.0 lb

## 2022-08-16 DIAGNOSIS — I10 Essential (primary) hypertension: Secondary | ICD-10-CM

## 2022-08-16 DIAGNOSIS — M1990 Unspecified osteoarthritis, unspecified site: Secondary | ICD-10-CM | POA: Diagnosis not present

## 2022-08-16 MED ORDER — LISINOPRIL-HYDROCHLOROTHIAZIDE 10-12.5 MG PO TABS: 1.0000 | ORAL_TABLET | Freq: Every day | ORAL | 1 refills | Status: DC

## 2022-08-16 MED ORDER — DICLOFENAC SODIUM 75 MG PO TBEC: 75.0000 mg | DELAYED_RELEASE_TABLET | Freq: Two times a day (BID) | ORAL | 2 refills | Status: DC

## 2022-08-16 NOTE — Patient Instructions (Signed)

## 2022-08-16 NOTE — Progress Notes (Signed)
Date:  08/16/2022   Name:  Joanna Huffman   DOB:  11-Sep-1975   MRN:  [id number]   Chief Complaint: Hypertension  Hypertension This is a chronic problem. The current episode started more than 1 year ago. The problem has been gradually improving since onset. The problem is controlled. Pertinent negatives include no blurred vision, chest pain, headaches, orthopnea, palpitations, peripheral edema, PND or shortness of breath. There are no associated agents to hypertension. Past treatments include ACE inhibitors and diuretics. The current treatment provides moderate improvement. There are no compliance problems.  There is no history of CAD/MI or CVA. There is no history of chronic renal disease, a hypertension causing med or renovascular disease.  Arthritis Presents for initial visit. The disease course has been fluctuating. She complains of pain and stiffness. Affected locations include the left knee, right knee and neck (lumbar). Her past medical history is significant for chronic back pain. Past treatments include NSAIDs. The treatment provided mild relief. Factors aggravating her arthritis include activity.    Lab Results  Component Value Date   NA 136 02/12/2022   K 4.8 02/12/2022   CO2 25 02/12/2022   GLUCOSE 89 02/12/2022   BUN 8 02/12/2022   CREATININE 0.87 02/12/2022   CALCIUM 10.3 (H) 02/12/2022   EGFR 82 02/12/2022   GFRNONAA >60 01/22/2020   Lab Results  Component Value Date   CHOL 171 02/03/2021   HDL 64 02/03/2021   LDLCALC 90 02/03/2021   TRIG 94 02/03/2021   CHOLHDL 2.6 01/22/2020   Lab Results  Component Value Date   TSH 1.340 10/04/2018   No results found for: "HGBA1C" No results found for: "WBC", "HGB", "HCT", "MCV", "PLT" Lab Results  Component Value Date   ALT 13 08/12/2021   AST 17 08/12/2021   ALKPHOS 53 08/12/2021   BILITOT 0.3 08/12/2021   No results found for: "25OHVITD2", "25OHVITD3", "VD25OH"   Review of Systems  HENT:  Negative for trouble  swallowing.   Eyes:  Negative for blurred vision and visual disturbance.  Respiratory:  Negative for chest tightness, shortness of breath and wheezing.   Cardiovascular:  Negative for chest pain, palpitations, orthopnea and PND.  Gastrointestinal:  Negative for blood in stool.  Genitourinary:  Negative for difficulty urinating, menstrual problem and vaginal bleeding.  Musculoskeletal:  Positive for arthritis, back pain and stiffness.  Neurological:  Negative for headaches.    Patient Active Problem List   Diagnosis Date Noted   Encounter for screening colonoscopy 03/08/2022   Current mild episode of major depressive disorder (HCC) 09/27/2017   Screening for breast cancer 06/29/2016   Blood pressure elevated without history of HTN 05/10/2016   Asymptomatic PVCs 05/10/2016    No Known Allergies  Past Surgical History:  Procedure Laterality Date   AUGMENTATION MAMMAPLASTY Bilateral 2013   BREAST SURGERY  2012   augmentation   COLONOSCOPY WITH PROPOFOL N/A 03/08/2022   Procedure: COLONOSCOPY WITH PROPOFOL;  Surgeon: Midge Minium, MD;  Location: [facility];  Service: Endoscopy;  Laterality: N/A;    Social History   Tobacco Use   Smoking status: Former    Current packs/day: 0.25    Average packs/day: 0.3 packs/day for 5.0 years (1.3 ttl pk-yrs)    Types: Cigarettes   Smokeless tobacco: Never  Vaping Use   Vaping status: Never Used  Substance Use Topics   Alcohol use: Yes    Comment: occasionally   Drug use: No     Medication list has  been reviewed and updated.  Current Meds  Medication Sig   ALPRAZolam (XANAX) 0.25 MG tablet Take 1 tablet (0.25 mg total) by mouth at bedtime as needed for anxiety.   Boswellia-Glucosamine-Vit D (OSTEO BI-FLEX ONE PER DAY PO) Take 2 capsules by mouth daily.   Cinnamon 500 MG TABS Take by mouth.   Levonorgest-Eth Est & Eth Est 42-21-21-7 DAYS TABS Take 1 tablet by mouth daily.   lisinopril-hydrochlorothiazide (ZESTORETIC) 10-12.5  MG tablet Take 1 tablet by mouth daily.   milk thistle 175 MG tablet Take 175 mg by mouth daily.   Multiple Vitamins-Minerals (MULTIVITAMIN WOMEN) TABS Take by mouth.   Omega-3 Fatty Acids (FISH OIL TRIPLE STRENGTH) 1400 MG CAPS Take by mouth.   Probiotic Product (PROBIOTIC BLEND PO) Take by mouth.   TURMERIC PO Take by mouth.       08/16/2022    8:22 AM 02/12/2022    8:56 AM 08/12/2021    9:15 AM 01/22/2021    8:05 AM  GAD 7 : Generalized Anxiety Score  Nervous, Anxious, on Edge 0 0 0 0  Control/stop worrying 0 0 0 0  Worry too much - different things 0 0 0 0  Trouble relaxing 0 0 0 0  Restless 0 0 0 0  Easily annoyed or irritable 0 0 0 0  Afraid - awful might happen 0 0 0 0  Total GAD 7 Score 0 0 0 0  Anxiety Difficulty Not difficult at all Not difficult at all Not difficult at all Not difficult at all       08/16/2022    8:21 AM 02/12/2022    8:56 AM 08/12/2021    9:15 AM  Depression screen PHQ 2/9  Decreased Interest 0 0 0  Down, Depressed, Hopeless 0 0 0  PHQ - 2 Score 0 0 0  Altered sleeping 0 0 0  Tired, decreased energy 0 0 0  Change in appetite 0 0 0  Feeling bad or failure about yourself  0 0 0  Trouble concentrating 0 0 0  Moving slowly or fidgety/restless 0 0 0  Suicidal thoughts 0 0 0  PHQ-9 Score 0 0 0  Difficult doing work/chores Not difficult at all Not difficult at all Not difficult at all    BP Readings from Last 3 Encounters:  08/16/22 120/70  04/21/22 105/72  03/08/22 120/68    Physical Exam Vitals and nursing note reviewed. Exam conducted with a chaperone present.  Constitutional:      General: She is not in acute distress.    Appearance: She is not diaphoretic.  HENT:     Head: Normocephalic and atraumatic.     Right Ear: External ear normal.     Left Ear: External ear normal.     Nose: Nose normal.  Eyes:     General:        Right eye: No discharge.        Left eye: No discharge.     Conjunctiva/sclera: Conjunctivae normal.     Pupils:  Pupils are equal, round, and reactive to light.  Neck:     Thyroid: No thyromegaly.     Vascular: No JVD.  Cardiovascular:     Rate and Rhythm: Normal rate and regular rhythm.     Heart sounds: Normal heart sounds. No murmur heard.    No friction rub. No gallop.  Pulmonary:     Effort: Pulmonary effort is normal.     Breath sounds: Normal breath sounds.  Abdominal:     General: Bowel sounds are normal.     Palpations: Abdomen is soft. There is no mass.     Tenderness: There is no abdominal tenderness. There is no guarding.  Musculoskeletal:        General: Normal range of motion.     Cervical back: Normal range of motion and neck supple.  Lymphadenopathy:     Cervical: No cervical adenopathy.  Skin:    General: Skin is warm and dry.  Neurological:     Mental Status: She is alert.     Deep Tendon Reflexes: Reflexes are normal and symmetric.     Wt Readings from Last 3 Encounters:  08/16/22 168 lb (76.2 kg)  04/21/22 163 lb 9.6 oz (74.2 kg)  03/08/22 166 lb (75.3 kg)    BP 120/70   Pulse 65   Ht 5' 3.5" (1.613 m)   Wt 168 lb (76.2 kg)   SpO2 97%   BMI 29.29 kg/m   Assessment and Plan: 1. Essential hypertension Chronic.  Controlled.  Stable.  Blood pressure today 120/70.  Continue lisinopril hydrochlorothiazide 10-12.5 mg once a day.  Asymptomatic.  Tolerating medications well.  Will check CMP for electrolytes and GFR.  Will recheck in 6 months.   - lisinopril-hydrochlorothiazide (ZESTORETIC) 10-12.5 MG tablet; Take 1 tablet by mouth daily.  Dispense: 90 tablet; Refill: 1 - Lipid Panel With LDL/HDL Ratio - Comprehensive Metabolic Panel (CMET)  2. Arthritis Chronic.  Recurrent.  Episodic.  Patient has a history of arthritis of both knees and lumbar back.  Patient has seen orthopedics in the past and was suggested that may need to have surgery in the future.  Patient would like to try possibility of prescription NSAID prior to see if this may be of benefit.  We will  prescribe diclofenac 75 mg twice up to twice a day although I told her to take 75 mg particularly when she is going to the gym in the morning and perhaps take Tylenol at night to lessen the effects on the kidneys. - diclofenac (VOLTAREN) 75 MG EC tablet; Take 1 tablet (75 mg total) by mouth 2 (two) times daily.  Dispense: 60 tablet; Refill: 2     Elizabeth Sauer, MD

## 2022-11-20 ENCOUNTER — Other Ambulatory Visit: Payer: Self-pay | Admitting: Family Medicine

## 2022-11-20 DIAGNOSIS — M1990 Unspecified osteoarthritis, unspecified site: Secondary | ICD-10-CM

## 2023-02-10 ENCOUNTER — Other Ambulatory Visit: Payer: Self-pay | Admitting: Family Medicine

## 2023-02-10 DIAGNOSIS — M1990 Unspecified osteoarthritis, unspecified site: Secondary | ICD-10-CM

## 2023-02-14 ENCOUNTER — Encounter: Payer: Self-pay | Admitting: Family Medicine

## 2023-02-14 ENCOUNTER — Ambulatory Visit: Payer: 59 | Admitting: Family Medicine

## 2023-02-14 VITALS — BP 116/76 | HR 82 | Ht 63.5 in | Wt 171.6 lb

## 2023-02-14 DIAGNOSIS — I10 Essential (primary) hypertension: Secondary | ICD-10-CM | POA: Diagnosis not present

## 2023-02-14 MED ORDER — LISINOPRIL-HYDROCHLOROTHIAZIDE 10-12.5 MG PO TABS: 1.0000 | ORAL_TABLET | Freq: Every day | ORAL | 1 refills | Status: AC

## 2023-02-14 NOTE — Progress Notes (Signed)
Date:  02/14/2023   Name:  Joanna Huffman   DOB:  Dec 04, 1975   MRN:  [id number]   Chief Complaint: Hypertension (Patient presents today to follow up on her HTN. She has been feeling well and taking her medication as directed. She needs a refill on lisinopril- hydrochlorothiazide. )  Hypertension This is a chronic problem. The current episode started more than 1 year ago. The problem has been gradually improving since onset. The problem is controlled. Pertinent negatives include no anxiety, blurred vision, chest pain, headaches, malaise/fatigue, neck pain, orthopnea, palpitations, peripheral edema, PND, shortness of breath or sweats. There are no associated agents to hypertension. There are no known risk factors for coronary artery disease. Past treatments include ACE inhibitors and diuretics. The current treatment provides moderate improvement. There are no compliance problems.  There is no history of angina, CAD/MI or CVA. There is no history of chronic renal disease, a hypertension causing med or renovascular disease.    Lab Results  Component Value Date   NA 141 08/16/2022   K 5.0 08/16/2022   CO2 25 08/16/2022   GLUCOSE 91 08/16/2022   BUN 14 08/16/2022   CREATININE 0.89 08/16/2022   CALCIUM 10.3 (H) 08/16/2022   EGFR 79 08/16/2022   GFRNONAA >60 01/22/2020   Lab Results  Component Value Date   CHOL 166 08/16/2022   HDL 82 08/16/2022   LDLCALC 69 08/16/2022   TRIG 80 08/16/2022   CHOLHDL 2.6 01/22/2020   Lab Results  Component Value Date   TSH 1.340 10/04/2018   No results found for: "HGBA1C" No results found for: "WBC", "HGB", "HCT", "MCV", "PLT" Lab Results  Component Value Date   ALT 15 08/16/2022   AST 21 08/16/2022   ALKPHOS 61 08/16/2022   BILITOT 0.2 08/16/2022   No results found for: "25OHVITD2", "25OHVITD3", "VD25OH"   Review of Systems  Constitutional: Negative.  Negative for chills, fatigue, fever, malaise/fatigue and unexpected weight change.  HENT:   Negative for congestion, ear discharge, ear pain, hearing loss, mouth sores, postnasal drip, rhinorrhea, sinus pressure, sneezing and sore throat.   Eyes:  Negative for blurred vision and visual disturbance.  Respiratory:  Negative for cough, chest tightness, shortness of breath, wheezing and stridor.   Cardiovascular:  Negative for chest pain, palpitations, orthopnea and PND.  Gastrointestinal:  Negative for abdominal pain, blood in stool, constipation, diarrhea and nausea.  Genitourinary:  Negative for dysuria, flank pain, frequency, hematuria, urgency and vaginal discharge.  Musculoskeletal:  Negative for arthralgias, back pain, myalgias and neck pain.  Skin:  Negative for rash.  Neurological:  Negative for dizziness, weakness and headaches.  Hematological:  Negative for adenopathy. Does not bruise/bleed easily.  Psychiatric/Behavioral:  Negative for dysphoric mood. The patient is not nervous/anxious.     Patient Active Problem List   Diagnosis Date Noted   Encounter for screening colonoscopy 03/08/2022   Current mild episode of major depressive disorder (HCC) 09/27/2017   Screening for breast cancer 06/29/2016   Blood pressure elevated without history of HTN 05/10/2016   Asymptomatic PVCs 05/10/2016    No Known Allergies  Past Surgical History:  Procedure Laterality Date   AUGMENTATION MAMMAPLASTY Bilateral 2013   BREAST SURGERY  2012   augmentation   COLONOSCOPY WITH PROPOFOL N/A 03/08/2022   Procedure: COLONOSCOPY WITH PROPOFOL;  Surgeon: Midge Minium, MD;  Location: [facility];  Service: Endoscopy;  Laterality: N/A;    Social History   Tobacco Use   Smoking status: Former  Current packs/day: 0.25    Average packs/day: 0.3 packs/day for 5.0 years (1.3 ttl pk-yrs)    Types: Cigarettes   Smokeless tobacco: Never  Vaping Use   Vaping status: Never Used  Substance Use Topics   Alcohol use: Yes    Comment: occasionally   Drug use: No     Medication list  has been reviewed and updated.  Current Meds  Medication Sig   Boswellia-Glucosamine-Vit D (OSTEO BI-FLEX ONE PER DAY PO) Take 2 capsules by mouth daily.   diclofenac (VOLTAREN) 75 MG EC tablet TAKE 1 TABLET BY MOUTH TWICE A DAY   Levonorgest-Eth Est & Eth Est 42-21-21-7 DAYS TABS Take 1 tablet by mouth daily.   lisinopril-hydrochlorothiazide (ZESTORETIC) 10-12.5 MG tablet Take 1 tablet by mouth daily.   Multiple Vitamins-Minerals (MULTIVITAMIN WOMEN) TABS Take by mouth.   Omega-3 Fatty Acids (FISH OIL TRIPLE STRENGTH) 1400 MG CAPS Take by mouth.   TURMERIC PO Take by mouth.       02/14/2023    8:16 AM 08/16/2022    8:22 AM 02/12/2022    8:56 AM 08/12/2021    9:15 AM  GAD 7 : Generalized Anxiety Score  Nervous, Anxious, on Edge 1 0 0 0  Control/stop worrying 0 0 0 0  Worry too much - different things 0 0 0 0  Trouble relaxing 0 0 0 0  Restless 0 0 0 0  Easily annoyed or irritable 1 0 0 0  Afraid - awful might happen 0 0 0 0  Total GAD 7 Score 2 0 0 0  Anxiety Difficulty Not difficult at all Not difficult at all Not difficult at all Not difficult at all       02/14/2023    8:16 AM 08/16/2022    8:21 AM 02/12/2022    8:56 AM  Depression screen PHQ 2/9  Decreased Interest  0 0  Down, Depressed, Hopeless 0 0 0  PHQ - 2 Score 0 0 0  Altered sleeping 0 0 0  Tired, decreased energy 2 0 0  Change in appetite 2 0 0  Feeling bad or failure about yourself  0 0 0  Trouble concentrating 0 0 0  Moving slowly or fidgety/restless 0 0 0  Suicidal thoughts 0 0 0  PHQ-9 Score 4 0 0  Difficult doing work/chores Not difficult at all Not difficult at all Not difficult at all    BP Readings from Last 3 Encounters:  02/14/23 116/76  08/16/22 120/70  04/21/22 105/72    Physical Exam Vitals and nursing note reviewed.  Constitutional:      General: She is not in acute distress.    Appearance: She is not diaphoretic.  HENT:     Head: Normocephalic and atraumatic.     Right Ear: Tympanic  membrane and external ear normal.     Left Ear: External ear normal.     Nose: Nose normal.     Mouth/Throat:     Mouth: Mucous membranes are moist.  Eyes:     General:        Right eye: No discharge.        Left eye: No discharge.     Conjunctiva/sclera: Conjunctivae normal.     Pupils: Pupils are equal, round, and reactive to light.  Neck:     Thyroid: No thyromegaly.     Vascular: No JVD.  Cardiovascular:     Rate and Rhythm: Normal rate and regular rhythm.     Heart sounds:  Normal heart sounds. No murmur heard.    No friction rub. No gallop.  Pulmonary:     Effort: Pulmonary effort is normal.     Breath sounds: Normal breath sounds. No wheezing, rhonchi or rales.  Abdominal:     General: Bowel sounds are normal.     Palpations: Abdomen is soft. There is no mass.     Tenderness: There is no abdominal tenderness. There is no guarding or rebound.  Musculoskeletal:        General: Normal range of motion.     Cervical back: Normal range of motion and neck supple.  Lymphadenopathy:     Cervical: No cervical adenopathy.  Skin:    General: Skin is warm and dry.  Neurological:     Mental Status: She is alert.     Deep Tendon Reflexes: Reflexes are normal and symmetric.     Wt Readings from Last 3 Encounters:  02/14/23 171 lb 9.6 oz (77.8 kg)  08/16/22 168 lb (76.2 kg)  04/21/22 163 lb 9.6 oz (74.2 kg)    BP 116/76   Pulse 82   Ht 5' 3.5" (1.613 m)   Wt 171 lb 9.6 oz (77.8 kg)   SpO2 92%   BMI 29.92 kg/m   Assessment and Plan: 1. Essential hypertension (Primary) Chronic.  Controlled.  Stable.  Asymptomatic.  Tolerating medication well.  Will continue lisinopril hydrochlorothiazide 10-12.5 mg once a day.  Review of previous labs are acceptable that were done in July.  Will recheck patient in 6 months. - lisinopril-hydrochlorothiazide (ZESTORETIC) 10-12.5 MG tablet; Take 1 tablet by mouth daily.  Dispense: 90 tablet; Refill: 1   Discussed coming off alprazolam and see  how she does with her panic attacks and discontinue the as needed alprazolam.  Elizabeth Sauer, MD

## 2023-02-24 DIAGNOSIS — M1711 Unilateral primary osteoarthritis, right knee: Secondary | ICD-10-CM | POA: Diagnosis not present

## 2023-02-27 DIAGNOSIS — M1711 Unilateral primary osteoarthritis, right knee: Secondary | ICD-10-CM | POA: Insufficient documentation

## 2023-03-07 ENCOUNTER — Other Ambulatory Visit: Payer: Self-pay | Admitting: Obstetrics and Gynecology

## 2023-03-07 DIAGNOSIS — Z1231 Encounter for screening mammogram for malignant neoplasm of breast: Secondary | ICD-10-CM

## 2023-03-10 ENCOUNTER — Other Ambulatory Visit: Payer: Self-pay | Admitting: Family Medicine

## 2023-03-10 DIAGNOSIS — M1990 Unspecified osteoarthritis, unspecified site: Secondary | ICD-10-CM

## 2023-03-28 ENCOUNTER — Ambulatory Visit
Admission: RE | Admit: 2023-03-28 | Discharge: 2023-03-28 | Disposition: A | Discharge status: Home / Self Care | Payer: 59 | Source: Ambulatory Visit | Attending: Obstetrics and Gynecology | Admitting: Obstetrics and Gynecology

## 2023-03-28 DIAGNOSIS — Z1231 Encounter for screening mammogram for malignant neoplasm of breast: Secondary | ICD-10-CM | POA: Diagnosis not present

## 2023-04-07 ENCOUNTER — Other Ambulatory Visit: Payer: Self-pay | Admitting: Family Medicine

## 2023-04-07 DIAGNOSIS — M1990 Unspecified osteoarthritis, unspecified site: Secondary | ICD-10-CM

## 2023-04-21 NOTE — Discharge Instructions (Addendum)
 Instructions after Total Knee Replacement   James P. Angie Fava., M.D.    Dept. of Orthopaedics & Sports Medicine [facility] [address] [place], [place]  [address]  Phone: [phone]   Fax: [phone]       [email or web address]       DIET: Drink plenty of non-alcoholic fluids. Resume your normal diet. Include foods high in fiber.  ACTIVITY:  You may use crutches or a walker with weight-bearing as tolerated, unless instructed otherwise. You may be weaned off of the walker or crutches by your Physical Therapist.  Do NOT place pillows under the knee. Anything placed under the knee could limit your ability to straighten the knee.   Use the Bone Foam 3 times a day for 30 minutes each session to help straighten the knee. Continue doing gentle exercises. Exercising will reduce the pain and swelling, increase motion, and prevent muscle weakness.   Please continue to use the TED compression stockings for 6 weeks. You may remove the stockings at night, but should reapply them in the morning. Do not drive or operate any equipment until instructed.  WOUND CARE:  The initial dressing (Aquacel) can remain in place for 7 days (see separate instructions). Continue to use the PolarCare or ice packs periodically to reduce pain and swelling. You may bathe or shower after the staples are removed at the first office visit following surgery.  MEDICATIONS: You may resume your regular medications. Please take the pain medication as prescribed on the medication. Do not take pain medication on an empty stomach. Unless instructed otherwise, you should take an enteric-coated aspirin 81 mg. TWICE a day. (This along with elevation will help reduce the possibility of blood clots/phlebitis in your operated leg.) Use a stool softener (such as Senokot-S or Colace) daily and a laxative (such as Miralax or Dulcolax) as needed to prevent constipation.  Do not drive or drink alcoholic beverages when  taking pain medications.  CALL THE OFFICE FOR: Temperature above 101 degrees Excessive bleeding or drainage on the dressing. Excessive swelling, coldness, or paleness of the toes. Persistent nausea and vomiting.  FOLLOW-UP:  You should have an appointment to return to the office in 10-14 days after surgery. Arrangements have been made for continuation of Physical Therapy (either home therapy or outpatient therapy).     [facility] Department Directory         [email or web address]       [email or web address]          Cardiology  Appointments: [place] - [phone]  Endocrinology  Appointments: [place] [phone] [place] - [phone]  Gastroenterology  Appointments: [place] [phone] [place] - [phone]        General Surgery   Appointments: [facility]  Internal Medicine/Family Medicine  Appointments: [facility] [place] - [phone] [place] - [phone]  Metabolic and Weigh Loss Surgery  Appointments: [facility]        Neurology  Appointments: [place] [phone] [place] - [phone]  Neurosurgery  Appointments: [place]  Obstetrics & Gynecology  Appointments: [place] [phone] [place] - [phone]        Pediatrics  Appointments: [place] [phone] [place] - [phone]  Physiatry  Appointments: [place] [phone]  Physical Therapy  Appointments: [place] - [phone]        Podiatry  Appointments: [place] [phone] [place] - [phone]  Pulmonology  Appointments: [place]  Rheumatology  Appointments: [place] [phone]  [place] Location: [facility]  [address] [place], [place]  [address]  [place] Location: [facility]. [address] [place], [place]  [address]  [place]  Location: [facility] [address] [place], [place]  [address]     [employer]  [address] [place], [place], [address]. [phone] They will call you to arrange when they can come to see you

## 2023-04-22 ENCOUNTER — Encounter
Admission: RE | Admit: 2023-04-22 | Discharge: 2023-04-22 | Disposition: A | Discharge status: Home / Self Care | Source: Ambulatory Visit | Attending: Orthopedic Surgery | Admitting: Orthopedic Surgery

## 2023-04-22 ENCOUNTER — Other Ambulatory Visit: Payer: Self-pay

## 2023-04-22 ENCOUNTER — Other Ambulatory Visit

## 2023-04-22 VITALS — HR 71 | Resp 14 | Ht 63.5 in | Wt 168.0 lb

## 2023-04-22 DIAGNOSIS — Z01810 Encounter for preprocedural cardiovascular examination: Secondary | ICD-10-CM | POA: Diagnosis not present

## 2023-04-22 DIAGNOSIS — Z01818 Encounter for other preprocedural examination: Secondary | ICD-10-CM | POA: Insufficient documentation

## 2023-04-22 DIAGNOSIS — I493 Ventricular premature depolarization: Secondary | ICD-10-CM | POA: Diagnosis not present

## 2023-04-22 DIAGNOSIS — M1711 Unilateral primary osteoarthritis, right knee: Secondary | ICD-10-CM | POA: Diagnosis not present

## 2023-04-22 LAB — COMPREHENSIVE METABOLIC PANEL WITH GFR
ALT: 18 U/L (ref 0–44)
AST: 23 U/L (ref 15–41)
Albumin: 4.1 g/dL (ref 3.5–5.0)
Alkaline Phosphatase: 42 U/L (ref 38–126)
Anion gap: 9 (ref 5–15)
BUN: 20 mg/dL (ref 6–20)
CO2: 25 mmol/L (ref 22–32)
Calcium: 9.4 mg/dL (ref 8.9–10.3)
Chloride: 104 mmol/L (ref 98–111)
Creatinine, Ser: 0.81 mg/dL (ref 0.44–1.00)
GFR, Estimated: >60 mL/min (ref >60)
Glucose, Bld: 97 mg/dL (ref 70–99)
Potassium: 3.9 mmol/L (ref 3.5–5.1)
Sodium: 138 mmol/L (ref 135–145)
Total Bilirubin: 0.5 mg/dL (ref 0.0–1.2)
Total Protein: 7 g/dL (ref 6.5–8.1)

## 2023-04-22 LAB — CBC
HCT: 38.1 % (ref 36.0–46.0)
Hemoglobin: 13.1 g/dL (ref 12.0–15.0)
MCH: 30 pg (ref 26.0–34.0)
MCHC: 34.4 g/dL (ref 30.0–36.0)
MCV: 87.4 fL (ref 80.0–100.0)
Platelets: 365 10*3/uL (ref 150–400)
RBC: 4.36 MIL/uL (ref 3.87–5.11)
RDW: 13.5 % (ref 11.5–15.5)
WBC: 7.5 10*3/uL (ref 4.0–10.5)
nRBC: 0 % (ref 0.0–0.2)

## 2023-04-22 LAB — URINALYSIS, ROUTINE W REFLEX MICROSCOPIC
Bilirubin Urine: NEGATIVE
Glucose, UA: NEGATIVE mg/dL
Hgb urine dipstick: NEGATIVE
Ketones, ur: NEGATIVE mg/dL
Leukocytes,Ua: NEGATIVE
Nitrite: NEGATIVE
Protein, ur: NEGATIVE mg/dL
Specific Gravity, Urine: 1.004 — ABNORMAL LOW (ref 1.005–1.030)
pH: 6 (ref 5.0–8.0)

## 2023-04-22 LAB — SURGICAL PCR SCREEN
MRSA, PCR: NEGATIVE
Staphylococcus aureus: NEGATIVE

## 2023-04-22 LAB — SEDIMENTATION RATE: Sed Rate: 2 mm/h (ref 0–30)

## 2023-04-22 LAB — C-REACTIVE PROTEIN: CRP: 0.7 mg/dL (ref <1.0)

## 2023-04-22 NOTE — Patient Instructions (Addendum)
 Your procedure is scheduled on: Monday 05/02/23 To find out your arrival time, please call [phone] between 1PM - 3PM on:   Friday 04/29/23 Report to the Registration Desk on the 1st floor of the [facility]. Free Valet parking is available.  If your arrival time is 6:00 am, do not arrive before that time as the [facility] entrance doors do not open until 6:00 am.  REMEMBER: Instructions that are not followed completely may result in serious medical risk, up to and including death; or upon the discretion of your surgeon and anesthesiologist your surgery may need to be rescheduled.  Do not eat food after midnight the night before surgery.  No gum chewing or hard candies.  You may however, drink CLEAR liquids up to 2 hours before you are scheduled to arrive for your surgery. Do not drink anything within 2 hours of your scheduled arrival time.  Clear liquids include: - water  - apple juice without pulp - gatorade (not RED colors) - black coffee or tea (Do NOT add milk or creamers to the coffee or tea) Do NOT drink anything that is not on this list.  Type 1 and Type 2 diabetics should only drink water.  In addition, your doctor has ordered for you to drink the provided:  Ensure Pre-Surgery Clear Carbohydrate Drink  Drinking this carbohydrate drink up to two hours before surgery helps to reduce insulin resistance and improve patient outcomes. Please complete drinking 2 hours before scheduled arrival time.  One week prior to surgery: Stop Anti-inflammatories (NSAIDS) such as Advil, Aleve, Ibuprofen, Motrin, Naproxen, Naprosyn and Aspirin based products such as Excedrin, Goody's Powder, BC Powder.  You may however, continue to take Tylenol if needed for pain up until the day of surgery.  Stop ANY OVER THE COUNTER supplements and vitamins on 04/25/23  until after surgery.  Continue taking all prescribed medications with the exception of the following: Voltaren stop taking 04/25/23 use  Tylenol as needed  TAKE ONLY THESE MEDICATIONS THE MORNING OF SURGERY WITH A SIP OF WATER:  Levonorgest  Zyrtec  No Alcohol for 24 hours before or after surgery.  No Smoking including e-cigarettes for 24 hours before surgery.  No chewable tobacco products for at least 6 hours before surgery.  No nicotine patches on the day of surgery.  Do not use any "recreational" drugs for at least a week (preferably 2 weeks) before your surgery.  Please be advised that the combination of cocaine and anesthesia may have negative outcomes, up to and including death. If you test positive for cocaine, your surgery will be cancelled.  On the morning of surgery brush your teeth with toothpaste and water, you may rinse your mouth with mouthwash if you wish. Do not swallow any toothpaste or mouthwash.  Use CHG Soap or wipes as directed on instruction sheet.  Do not wear lotions, powders, or perfumes on the day of surgery/  Do not shave body hair from the neck down 48 hours before surgery.  Wear comfortable clothing (specific to your surgery type) to the hospital.  Do not wear jewelry, make-up, hairpins, clips or toenail polish.  For welded (permanent) jewelry: bracelets, anklets, waist bands, etc.  Please have this removed prior to surgery.  If it is not removed, there is a chance that hospital personnel will need to cut it off on the day of surgery. Contact lenses, hearing aids and dentures may not be worn into surgery.  Do not bring valuables to the hospital.  [facility] is not responsible for any missing/lost belongings or valuables.   Notify your doctor if there is any change in your medical condition (cold, fever, infection).  If you are being discharged the day of surgery, you will not be allowed to drive home. You will need a responsible individual to drive you home and stay with you for 24 hours after surgery.   If you are taking public transportation, you will need to have a responsible  individual with you.  If you are being admitted to the hospital overnight, leave your suitcase in the car. After surgery it may be brought to your room.  In case of increased patient census, it may be necessary for you, the patient, to continue your postoperative care in the Same Day Surgery department.  After surgery, you can help prevent lung complications by doing breathing exercises.  Take deep breaths and cough every 1-2 hours. Your doctor may order a device called an Incentive Spirometer to help you take deep breaths. When coughing or sneezing, hold a pillow firmly against your incision with both hands. This is called "splinting." Doing this helps protect your incision. It also decreases belly discomfort.  Surgery Visitation Policy:  Patients undergoing a surgery or procedure may have two family members or support persons with them as long as the person is not COVID-19 positive or experiencing its symptoms.   Inpatient Visitation:    Visiting hours are 7 a.m. to 8 p.m. Up to four visitors are allowed at one time in a patient room. The visitors may rotate out with other people during the day. One designated support person (adult) may remain overnight.  Please call the Pre-admissions Testing Dept. at [phone] if you have any questions about these instructions.    Pre-operative 5 CHG Bath Instructions   You can play a key role in reducing the risk of infection after surgery. Your skin needs to be as free of germs as possible. You can reduce the number of germs on your skin by washing with CHG (chlorhexidine gluconate) soap before surgery. CHG is an antiseptic soap that kills germs and continues to kill germs even after washing.   DO NOT use if you have an allergy to chlorhexidine/CHG or antibacterial soaps. If your skin becomes reddened or irritated, stop using the CHG and notify one of our RNs at [phone].   Please shower with the CHG soap starting 4 days before surgery  using the following schedule:   Thursday 04/28/23 - Monday 05/02/23    Please keep in mind the following:  DO NOT shave, including legs and underarms, starting the day of your first shower.   You may shave your face at any point before/day of surgery.  Place clean sheets on your bed the day you start using CHG soap. Use a clean washcloth (not used since being washed) for each shower. DO NOT sleep with pets once you start using the CHG.   CHG Shower Instructions:  If you choose to wash your hair and private area, wash first with your normal shampoo/soap.  After you use shampoo/soap, rinse your hair and body thoroughly to remove shampoo/soap residue.  Turn the water OFF and apply about 3 tablespoons (45 ml) of CHG soap to a CLEAN washcloth.  Apply CHG soap ONLY FROM YOUR NECK DOWN TO YOUR TOES (washing for 3-5 minutes)  DO NOT use CHG soap on face, private areas, open wounds, or sores.  Pay special attention to the area where your surgery  is being performed.  If you are having back surgery, having someone wash your back for you may be helpful. Wait 2 minutes after CHG soap is applied, then you may rinse off the CHG soap.  Pat dry with a clean towel  Put on clean clothes/pajamas   If you choose to wear lotion, please use ONLY the CHG-compatible lotions on the back of this paper.     Additional instructions for the day of surgery: DO NOT APPLY any lotions, deodorants, cologne, or perfumes.   Put on clean/comfortable clothes.  Brush your teeth.  Ask your nurse before applying any prescription medications to the skin.      CHG Compatible Lotions   Aveeno Moisturizing lotion  Cetaphil Moisturizing Cream  Cetaphil Moisturizing Lotion  Clairol Herbal Essence Moisturizing Lotion, Dry Skin  Clairol Herbal Essence Moisturizing Lotion, Extra Dry Skin  Clairol Herbal Essence Moisturizing Lotion, Normal Skin  Curel Age Defying Therapeutic Moisturizing Lotion with Alpha Hydroxy  Curel  Extreme Care Body Lotion  Curel Soothing Hands Moisturizing Hand Lotion  Curel Therapeutic Moisturizing Cream, Fragrance-Free  Curel Therapeutic Moisturizing Lotion, Fragrance-Free  Curel Therapeutic Moisturizing Lotion, Original Formula  Eucerin Daily Replenishing Lotion  Eucerin Dry Skin Therapy Plus Alpha Hydroxy Crme  Eucerin Dry Skin Therapy Plus Alpha Hydroxy Lotion  Eucerin Original Crme  Eucerin Original Lotion  Eucerin Plus Crme Eucerin Plus Lotion  Eucerin TriLipid Replenishing Lotion  Keri Anti-Bacterial Hand Lotion  Keri Deep Conditioning Original Lotion Dry Skin Formula Softly Scented  Keri Deep Conditioning Original Lotion, Fragrance Free Sensitive Skin Formula  Keri Lotion Fast Absorbing Fragrance Free Sensitive Skin Formula  Keri Lotion Fast Absorbing Softly Scented Dry Skin Formula  Keri Original Lotion  Keri Skin Renewal Lotion Keri Silky Smooth Lotion  Keri Silky Smooth Sensitive Skin Lotion  Nivea Body Creamy Conditioning Oil  Nivea Body Extra Enriched Lotion  Nivea Body Original Lotion  Nivea Body Sheer Moisturizing Lotion Nivea Crme  Nivea Skin Firming Lotion  NutraDerm 30 Skin Lotion  NutraDerm Skin Lotion  NutraDerm Therapeutic Skin Cream  NutraDerm Therapeutic Skin Lotion  ProShield Protective Hand Cream  Provon moisturizing lotion  How to Use an Incentive Spirometer  An incentive spirometer is a tool that measures how well you are filling your lungs with each breath. Learning to take long, deep breaths using this tool can help you keep your lungs clear and active. This may help to reverse or lessen your chance of developing breathing (pulmonary) problems, especially infection. You may be asked to use a spirometer: After a surgery. If you have a lung problem or a history of smoking. After a long period of time when you have been unable to move or be active. If the spirometer includes an indicator to show the highest number that you have reached, your  health care provider or respiratory therapist will help you set a goal. Keep a log of your progress as told by your health care provider. What are the risks? Breathing too quickly may cause dizziness or cause you to pass out. Take your time so you do not get dizzy or light-headed. If you are in pain, you may need to take pain medicine before doing incentive spirometry. It is harder to take a deep breath if you are having pain. How to use your incentive spirometer  Sit up on the edge of your bed or on a chair. Hold the incentive spirometer so that it is in an upright position. Before you use the spirometer, breathe  out normally. Place the mouthpiece in your mouth. Make sure your lips are closed tightly around it. Breathe in slowly and as deeply as you can through your mouth, causing the piston or the ball to rise toward the top of the chamber. Hold your breath for 3-5 seconds, or for as long as possible. If the spirometer includes a coach indicator, use this to guide you in breathing. Slow down your breathing if the indicator goes above the marked areas. Remove the mouthpiece from your mouth and breathe out normally. The piston or ball will return to the bottom of the chamber. Rest for a few seconds, then repeat the steps 10 or more times. Take your time and take a few normal breaths between deep breaths so that you do not get dizzy or light-headed. Do this every 1-2 hours when you are awake. If the spirometer includes a goal marker to show the highest number you have reached (best effort), use this as a goal to work toward during each repetition. After each set of 10 deep breaths, cough a few times. This will help to make sure that your lungs are clear. If you have an incision on your chest or abdomen from surgery, place a pillow or a rolled-up towel firmly against the incision when you cough. This can help to reduce pain while taking deep breaths and coughing. General tips When you are able to  get out of bed: Walk around often. Continue to take deep breaths and cough in order to clear your lungs. Keep using the incentive spirometer until your health care provider says it is okay to stop using it. If you have been in the hospital, you may be told to keep using the spirometer at home. Contact a health care provider if: You are having difficulty using the spirometer. You have trouble using the spirometer as often as instructed. Your pain medicine is not giving enough relief for you to use the spirometer as told. You have a fever. Get help right away if: You develop shortness of breath. You develop a cough with bloody mucus from the lungs. You have fluid or blood coming from an incision site after you cough. Summary An incentive spirometer is a tool that can help you learn to take long, deep breaths to keep your lungs clear and active. You may be asked to use a spirometer after a surgery, if you have a lung problem or a history of smoking, or if you have been inactive for a long period of time. Use your incentive spirometer as instructed every 1-2 hours while you are awake. If you have an incision on your chest or abdomen, place a pillow or a rolled-up towel firmly against your incision when you cough. This will help to reduce pain. Get help right away if you have shortness of breath, you cough up bloody mucus, or blood comes from your incision when you cough. This information is not intended to replace advice given to you by your health care provider. Make sure you discuss any questions you have with your health care provider. Document Revised: 03/26/2019 Document Reviewed: 03/26/2019 Elsevier Patient Education  2023 [employer].

## 2023-04-25 DIAGNOSIS — M1711 Unilateral primary osteoarthritis, right knee: Secondary | ICD-10-CM | POA: Diagnosis not present

## 2023-04-27 DIAGNOSIS — Z124 Encounter for screening for malignant neoplasm of cervix: Secondary | ICD-10-CM | POA: Diagnosis not present

## 2023-04-27 DIAGNOSIS — Z1331 Encounter for screening for depression: Secondary | ICD-10-CM | POA: Diagnosis not present

## 2023-04-27 DIAGNOSIS — Z01419 Encounter for gynecological examination (general) (routine) without abnormal findings: Secondary | ICD-10-CM | POA: Diagnosis not present

## 2023-04-30 NOTE — Anesthesia Preprocedure Evaluation (Signed)
 Anesthesia Evaluation  Patient identified by MRN, date of birth, ID band Patient awake    Reviewed: Allergy & Precautions, H&P , NPO status , Patient's Chart, lab work & pertinent test results  Airway Mallampati: II  TM Distance: >3 FB Neck ROM: full    Dental no notable dental hx.    Pulmonary former smoker   Pulmonary exam normal        Cardiovascular hypertension, Normal cardiovascular exam     Neuro/Psych negative neurological ROS  negative psych ROS   GI/Hepatic negative GI ROS, Neg liver ROS,,,  Endo/Other  negative endocrine ROS    Renal/GU      Musculoskeletal  (+) Arthritis ,    Abdominal Normal abdominal exam  (+)   Peds  Hematology negative hematology ROS (+)   Anesthesia Other Findings Past Medical History: No date: Arthritis     Comment:  L4-5, knees, maybe hands No date: Dysrhythmia     Comment:  Pt states occasional pounding heart No date: Hypertension No date: Seasonal allergies  Past Surgical History: 2013: AUGMENTATION MAMMAPLASTY; Bilateral 2012: BREAST SURGERY     Comment:  augmentation 03/08/2022: COLONOSCOPY WITH PROPOFOL; N/A     Comment:  Procedure: COLONOSCOPY WITH PROPOFOL;  Surgeon: Marnee Sink, MD;  Location: [facility];  Service:               Endoscopy;  Laterality: N/A;     Reproductive/Obstetrics negative OB ROS                             Anesthesia Physical Anesthesia Plan  ASA: 2  Anesthesia Plan: Spinal   Post-op Pain Management: Celebrex PO (pre-op)*, Ofirmev IV (intra-op)* and Regional block*   Induction: Intravenous  PONV Risk Score and Plan: Propofol infusion  Airway Management Planned: Natural Airway  Additional Equipment:   Intra-op Plan:   Post-operative Plan:   Informed Consent: I have reviewed the patients History and Physical, chart, labs and discussed the procedure including the risks,  benefits and alternatives for the proposed anesthesia with the patient or authorized representative who has indicated his/her understanding and acceptance.     Dental Advisory Given  Plan Discussed with: CRNA and Surgeon  Anesthesia Plan Comments:         Anesthesia Quick Evaluation

## 2023-05-01 ENCOUNTER — Encounter: Payer: Self-pay | Admitting: Orthopedic Surgery

## 2023-05-01 MED ORDER — CHLORHEXIDINE GLUCONATE 4 % EX SOLN
60.0000 mL | Freq: Once | CUTANEOUS | Status: AC
  Administered 2023-05-02: 4 via TOPICAL

## 2023-05-01 MED ORDER — CEFAZOLIN SODIUM-DEXTROSE 2-4 GM/100ML-% IV SOLN
2.0000 g | INTRAVENOUS | Status: AC
  Administered 2023-05-02: 2 g via INTRAVENOUS

## 2023-05-01 MED ORDER — LACTATED RINGERS IV SOLN: INTRAVENOUS | Status: DC

## 2023-05-01 MED ORDER — CELECOXIB 200 MG PO CAPS
400.0000 mg | ORAL_CAPSULE | Freq: Once | ORAL | Status: AC
  Administered 2023-05-02: 400 mg via ORAL

## 2023-05-01 MED ORDER — ORAL CARE MOUTH RINSE: 15.0000 mL | Freq: Once | OROMUCOSAL | Status: AC

## 2023-05-01 MED ORDER — GABAPENTIN 300 MG PO CAPS
300.0000 mg | ORAL_CAPSULE | Freq: Once | ORAL | Status: AC
  Administered 2023-05-02: 300 mg via ORAL

## 2023-05-01 MED ORDER — CHLORHEXIDINE GLUCONATE 0.12 % MT SOLN
15.0000 mL | Freq: Once | OROMUCOSAL | Status: AC
  Administered 2023-05-02: 15 mL via OROMUCOSAL

## 2023-05-01 MED ORDER — DEXAMETHASONE SODIUM PHOSPHATE 10 MG/ML IJ SOLN
8.0000 mg | Freq: Once | INTRAMUSCULAR | Status: AC
  Administered 2023-05-02: 8 mg via INTRAVENOUS

## 2023-05-01 MED ORDER — TRANEXAMIC ACID-NACL 1000-0.7 MG/100ML-% IV SOLN
1000.0000 mg | INTRAVENOUS | Status: AC
  Administered 2023-05-02: 1000 mg via INTRAVENOUS

## 2023-05-01 NOTE — H&P (Signed)
 ORTHOPAEDIC HISTORY & PHYSICAL Joanna Huffman, [place] - 04/25/2023 3:30 PM EDT Formatting of this note is different from the original. NAME: Joanna Huffman H&P Date: 04/25/2023 Procedure Date: 05/02/2023  Chief Complaint: right knee pain  HPI Joanna Huffman is a 48 y.o. female who has severe Right knee pain. She reports a longstanding history of progressive right knee discomfort. She states that most of the pain localizes along the medial aspect of her knee. She denies any noticeable swelling, locking symptoms or feelings of giving way. She states that the pain is made worse with any prolonged weightbearing or ambulation. Has greatly affected her ability to ambulate long distances and perform her ADLs if she would like. She has failed conservative treatment including Tylenol, NSAIDs, intra-articular corticosteroid injections, and activity modification. She is not utilizing any ambulatory aids currently . She has requested operative intervention for relief of her DJD symptoms. She denies any cardiac or pulmonary issues. No history of clots or DVTs. Has no previous surgeries on this right knee. Patient is not a diabetic.  Of note, the patient states that she would like to do outpatient physical therapy at [facility] in [place].  Social Hx: Patient lives at home with her husband and son. She still works a job working as a Interior and spatial designer. She admits to 1 standard drink of alcohol a week. Denies any illicit drug use. No smoking or nicotine use.  Medications & Allergies Allergies: No Known Allergies  Home Medicines: Current Outpatient Medications on File Prior to Visit Medication Sig Dispense Refill acetaminophen (TYLENOL) 500 MG tablet Take 1,000 mg by mouth once daily as needed for Pain AFLURIA QUAD 2018-2019, PF, 60 mcg/0.5 mL IM injection 0 diclofenac (VOLTAREN) 75 MG EC tablet Take 1 tablet by mouth 2 (two) times daily hydroCHLOROthiazide (HYDRODIURIL) 12.5 MG tablet 3 L  norgest/e.estradiol-e.estrad (QUARTETTE) 0.15 mg-20 mcg/ 0.15 mg-25 mcg Take 1 tablet by mouth once daily lisinopriL-hydrochlorothiazide (ZESTORETIC) 10-12.5 mg tablet Take 1 tablet by mouth once daily  No current facility-administered medications on file prior to visit.  Medical / Surgical History  Past Medical History: Diagnosis Date Chicken pox Hypertension   Past Surgical History: Procedure Laterality Date BILATERAL BREAST AUGMENTATION   Physical Exam  Ht:160 cm (5\' 3" ) Wt: BMI: Body mass index is 30.08 kg/m.  General/Constitutional: No apparent distress: well-nourished and well developed. Eyes: Pupils equal, round with synchronous movement. Lymphatic: No palpable adenopathy. Respiratory: Patient has good chest rise and fall with inspiration and expiration. All lung fields are clear to auscultation bilaterally. There is no Rales, rhonchi or wheezes appreciated. Cardiovascular: Upon auscultation there is a regular rate and rhythm without any murmurs, rubs, gallops or heaves appreciated. There does not appear to be any swelling down the lower extremities. Posterior tibial pulses appreciated bilaterally, 2+. Integumentary: No impressive skin lesions present, except as noted in detailed exam. Neuro/Psych: Normal mood and affect, oriented to person, place and time. Musculoskeletal: see exam below  Right knee exam Upon inspection of the patient's right knee there does not appear to be any skin changes, open abrasions, swelling or redness. There is a varus alignment. Upon palpation, the patient reports having pain along the medial aspect of their knee. Patient has approximately 5 degrees off of full extension actively with ROM, and able to flex back to 120 degrees with mild pain. Varus and valgus stress testing shows positive pseudolaxity to valgus stressing. The patella tracks well within the femoral groove from flexion into extension with mild crepitation appreciated.  Anterior  and posterior drawer testing negative. Patient is neurovascularly intact down their lower extremity to all dermatomes. Posterior tibial pulses appreciated 2+.  Imaging right Knee Imaging: None ordered today. Previous images from 02/24/2023 were reviewed. These images included AP, lateral and sunrise views of the patient's right knee. There was noticeably significant narrowing of the medial cartilage space with 100% joint space closure and bone-on-bone articulation. Overall alignment was mild varus. Subchondral sclerosis is noted. Osteophyte formation was present. No fractures, lytic lesions or gross fomites appreciated on films.  Assesment and Plan Knee DJD  I have recommended that Joanna Huffman undergo right total knee replacement. Consents has been signed. The risks, benefits, prognosis and alternatives including but not limited to DVT, PE, infection, neurovascular injury, failure of the procedure and death were explained to the patient and she is willing to proceed with surgery as described to her by myself. Plan will be for post operative admission of at least 1 midnight for pain control and PT. She will be managed with DVT prophylaxis, antibiotics preoperatively for 24 hours and aggressive in patient rehab.  Pre, intra and post op interventions were discussed. Patient has good understanding  Medication Reconciliation was performed. Discussed cessation of NSAIDs, vitamins and supplements.  A total of 45 minutes was spent reviewing patient's charts, medical reconciliation, discussing/educating the patient about surgical interventions, and answering any questions provided by the patient.  Joanna Alex Hylan, PA [facility] 04/25/2023  Electronically signed by Joanna Sames, PA at 04/25/2023 8:16 PM EDT

## 2023-05-02 ENCOUNTER — Other Ambulatory Visit: Payer: Self-pay

## 2023-05-02 ENCOUNTER — Ambulatory Visit: Payer: Self-pay | Admitting: Anesthesiology

## 2023-05-02 ENCOUNTER — Encounter: Payer: Self-pay | Admitting: Orthopedic Surgery

## 2023-05-02 ENCOUNTER — Encounter
Admission: RE | Disposition: A | Discharge status: Home / Self Care | Payer: Self-pay | Source: Home / Self Care | Attending: Orthopedic Surgery

## 2023-05-02 ENCOUNTER — Observation Stay
Admission: RE | Admit: 2023-05-02 | Discharge: 2023-05-03 | Disposition: A | Discharge status: Home / Self Care | Attending: Orthopedic Surgery | Admitting: Orthopedic Surgery

## 2023-05-02 ENCOUNTER — Ambulatory Visit: Payer: Self-pay | Admitting: Urgent Care

## 2023-05-02 ENCOUNTER — Observation Stay

## 2023-05-02 DIAGNOSIS — Z79899 Other long term (current) drug therapy: Secondary | ICD-10-CM | POA: Insufficient documentation

## 2023-05-02 DIAGNOSIS — Z96651 Presence of right artificial knee joint: Secondary | ICD-10-CM | POA: Diagnosis not present

## 2023-05-02 DIAGNOSIS — Z01818 Encounter for other preprocedural examination: Secondary | ICD-10-CM

## 2023-05-02 DIAGNOSIS — I10 Essential (primary) hypertension: Secondary | ICD-10-CM | POA: Insufficient documentation

## 2023-05-02 DIAGNOSIS — I493 Ventricular premature depolarization: Principal | ICD-10-CM

## 2023-05-02 DIAGNOSIS — M1711 Unilateral primary osteoarthritis, right knee: Principal | ICD-10-CM | POA: Insufficient documentation

## 2023-05-02 HISTORY — PX: KNEE ARTHROPLASTY: SHX992

## 2023-05-02 LAB — POCT PREGNANCY, URINE: Preg Test, Ur: NEGATIVE

## 2023-05-02 SURGERY — ARTHROPLASTY, KNEE, TOTAL, USING IMAGELESS COMPUTER-ASSISTED NAVIGATION
Anesthesia: Spinal | Site: Knee | Laterality: Right

## 2023-05-02 MED ORDER — PROPOFOL 1000 MG/100ML IV EMUL
INTRAVENOUS | Status: AC
Start: 2023-05-02 — End: ?
  Filled 2023-05-02: qty 100

## 2023-05-02 MED ORDER — ONDANSETRON HCL 4 MG PO TABS: 4.0000 mg | ORAL_TABLET | Freq: Four times a day (QID) | ORAL | Status: DC | PRN

## 2023-05-02 MED ORDER — METOCLOPRAMIDE HCL 10 MG PO TABS
10.0000 mg | ORAL_TABLET | Freq: Three times a day (TID) | ORAL | Status: DC
  Administered 2023-05-02 – 2023-05-03 (×2): 10 mg via ORAL
  Filled 2023-05-02 (×2): qty 1

## 2023-05-02 MED ORDER — LISINOPRIL 5 MG PO TABS
ORAL_TABLET | ORAL | Status: AC
  Filled 2023-05-02: qty 1

## 2023-05-02 MED ORDER — ALUM & MAG HYDROXIDE-SIMETH 200-200-20 MG/5ML PO SUSP: 30.0000 mL | ORAL | Status: DC | PRN

## 2023-05-02 MED ORDER — PHENOL 1.4 % MT LIQD: 1.0000 | OROMUCOSAL | Status: DC | PRN

## 2023-05-02 MED ORDER — MIDAZOLAM HCL 2 MG/2ML IJ SOLN
INTRAMUSCULAR | Status: AC
  Filled 2023-05-02: qty 2

## 2023-05-02 MED ORDER — TRAMADOL HCL 50 MG PO TABS
50.0000 mg | ORAL_TABLET | ORAL | Status: DC | PRN
  Administered 2023-05-02: 50 mg via ORAL
  Administered 2023-05-02: 100 mg via ORAL
  Filled 2023-05-02: qty 1
  Filled 2023-05-02: qty 2

## 2023-05-02 MED ORDER — OXYCODONE HCL 5 MG PO TABS: 5.0000 mg | ORAL_TABLET | Freq: Once | ORAL | Status: DC | PRN

## 2023-05-02 MED ORDER — FENTANYL CITRATE (PF) 100 MCG/2ML IJ SOLN
INTRAMUSCULAR | Status: DC | PRN
  Administered 2023-05-02: 50 ug via INTRAVENOUS

## 2023-05-02 MED ORDER — SURGIPHOR WOUND IRRIGATION SYSTEM - OPTIME
TOPICAL | Status: DC | PRN
  Administered 2023-05-02: 450 mL via TOPICAL

## 2023-05-02 MED ORDER — LEVONORGEST-ETH EST & ETH EST 42-21-21-7 DAYS PO TABS: 1.0000 | ORAL_TABLET | Freq: Every day | ORAL | Status: DC

## 2023-05-02 MED ORDER — FENTANYL CITRATE (PF) 100 MCG/2ML IJ SOLN
INTRAMUSCULAR | Status: AC
  Filled 2023-05-02: qty 2

## 2023-05-02 MED ORDER — ONDANSETRON HCL 4 MG/2ML IJ SOLN
INTRAMUSCULAR | Status: DC | PRN
  Administered 2023-05-02: 4 mg via INTRAVENOUS

## 2023-05-02 MED ORDER — SEVOFLURANE IN SOLN
RESPIRATORY_TRACT | Status: AC
  Filled 2023-05-02: qty 250

## 2023-05-02 MED ORDER — GABAPENTIN 300 MG PO CAPS
ORAL_CAPSULE | ORAL | Status: AC
  Filled 2023-05-02: qty 1

## 2023-05-02 MED ORDER — BUPIVACAINE HCL (PF) 0.5 % IJ SOLN
INTRAMUSCULAR | Status: DC | PRN
  Administered 2023-05-02: 3 mL

## 2023-05-02 MED ORDER — CELECOXIB 200 MG PO CAPS
200.0000 mg | ORAL_CAPSULE | Freq: Two times a day (BID) | ORAL | Status: DC
Start: 2023-05-02 — End: 2023-05-03
  Administered 2023-05-02 – 2023-05-03 (×2): 200 mg via ORAL
  Filled 2023-05-02 (×2): qty 1

## 2023-05-02 MED ORDER — SODIUM CHLORIDE 0.9 % IV SOLN: INTRAVENOUS | Status: DC

## 2023-05-02 MED ORDER — PHENYLEPHRINE HCL-NACL 20-0.9 MG/250ML-% IV SOLN
INTRAVENOUS | Status: DC | PRN
  Administered 2023-05-02: 30 ug/min via INTRAVENOUS

## 2023-05-02 MED ORDER — SENNOSIDES-DOCUSATE SODIUM 8.6-50 MG PO TABS
1.0000 | ORAL_TABLET | Freq: Two times a day (BID) | ORAL | Status: DC
  Administered 2023-05-02 – 2023-05-03 (×2): 1 via ORAL
  Filled 2023-05-02 (×2): qty 1

## 2023-05-02 MED ORDER — PHENYLEPHRINE HCL-NACL 20-0.9 MG/250ML-% IV SOLN
INTRAVENOUS | Status: AC
  Filled 2023-05-02: qty 250

## 2023-05-02 MED ORDER — HYDROMORPHONE HCL 1 MG/ML IJ SOLN: 0.5000 mg | INTRAMUSCULAR | Status: DC | PRN

## 2023-05-02 MED ORDER — OXYCODONE HCL 5 MG PO TABS: 10.0000 mg | ORAL_TABLET | ORAL | Status: DC | PRN

## 2023-05-02 MED ORDER — OXYCODONE HCL 5 MG/5ML PO SOLN: 5.0000 mg | Freq: Once | ORAL | Status: DC | PRN

## 2023-05-02 MED ORDER — TRANEXAMIC ACID-NACL 1000-0.7 MG/100ML-% IV SOLN
1000.0000 mg | Freq: Once | INTRAVENOUS | Status: AC
  Administered 2023-05-02: 1000 mg via INTRAVENOUS

## 2023-05-02 MED ORDER — ONDANSETRON HCL 4 MG/2ML IJ SOLN
INTRAMUSCULAR | Status: AC
  Filled 2023-05-02: qty 2

## 2023-05-02 MED ORDER — ACETAMINOPHEN 325 MG PO TABS: 325.0000 mg | ORAL_TABLET | Freq: Four times a day (QID) | ORAL | Status: DC | PRN

## 2023-05-02 MED ORDER — DEXAMETHASONE SODIUM PHOSPHATE 10 MG/ML IJ SOLN
INTRAMUSCULAR | Status: AC
  Filled 2023-05-02: qty 1

## 2023-05-02 MED ORDER — SODIUM CHLORIDE 0.9 % IR SOLN
Status: DC | PRN
  Administered 2023-05-02: 3000 mL

## 2023-05-02 MED ORDER — SODIUM CHLORIDE 0.9 % IV SOLN
INTRAVENOUS | Status: DC | PRN
  Administered 2023-05-02: 60 mL

## 2023-05-02 MED ORDER — FLEET ENEMA RE ENEM: 1.0000 | ENEMA | Freq: Once | RECTAL | Status: DC | PRN

## 2023-05-02 MED ORDER — CELECOXIB 200 MG PO CAPS
ORAL_CAPSULE | ORAL | Status: AC
  Filled 2023-05-02: qty 2

## 2023-05-02 MED ORDER — BUPIVACAINE HCL (PF) 0.25 % IJ SOLN
INTRAMUSCULAR | Status: DC | PRN
  Administered 2023-05-02: 60 mL

## 2023-05-02 MED ORDER — HYDROCHLOROTHIAZIDE 12.5 MG PO TABS
12.5000 mg | ORAL_TABLET | Freq: Every day | ORAL | Status: DC
  Administered 2023-05-02 – 2023-05-03 (×2): 12.5 mg via ORAL
  Filled 2023-05-02 (×2): qty 1

## 2023-05-02 MED ORDER — TRANEXAMIC ACID-NACL 1000-0.7 MG/100ML-% IV SOLN
INTRAVENOUS | Status: AC
  Filled 2023-05-02: qty 100

## 2023-05-02 MED ORDER — ACETAMINOPHEN 10 MG/ML IV SOLN
1000.0000 mg | Freq: Four times a day (QID) | INTRAVENOUS | Status: AC
  Administered 2023-05-02 – 2023-05-03 (×4): 1000 mg via INTRAVENOUS
  Filled 2023-05-02 (×3): qty 100

## 2023-05-02 MED ORDER — ONDANSETRON HCL 4 MG/2ML IJ SOLN: 4.0000 mg | Freq: Four times a day (QID) | INTRAMUSCULAR | Status: DC | PRN

## 2023-05-02 MED ORDER — CEFAZOLIN SODIUM-DEXTROSE 2-4 GM/100ML-% IV SOLN
2.0000 g | Freq: Four times a day (QID) | INTRAVENOUS | Status: AC
  Administered 2023-05-02 (×2): 2 g via INTRAVENOUS
  Filled 2023-05-02 (×2): qty 100

## 2023-05-02 MED ORDER — LIDOCAINE HCL (PF) 2 % IJ SOLN
INTRAMUSCULAR | Status: AC
  Filled 2023-05-02: qty 5

## 2023-05-02 MED ORDER — ACETAMINOPHEN 10 MG/ML IV SOLN
INTRAVENOUS | Status: DC | PRN
  Administered 2023-05-02: 1000 mg via INTRAVENOUS

## 2023-05-02 MED ORDER — ENSURE PRE-SURGERY PO LIQD
296.0000 mL | Freq: Once | ORAL | Status: AC
  Administered 2023-05-02: 296 mL via ORAL
  Filled 2023-05-02: qty 296

## 2023-05-02 MED ORDER — FENTANYL CITRATE (PF) 100 MCG/2ML IJ SOLN: 25.0000 ug | INTRAMUSCULAR | Status: DC | PRN

## 2023-05-02 MED ORDER — MIDAZOLAM HCL 5 MG/5ML IJ SOLN
INTRAMUSCULAR | Status: DC | PRN
  Administered 2023-05-02: 2 mg via INTRAVENOUS

## 2023-05-02 MED ORDER — PHENYLEPHRINE 80 MCG/ML (10ML) SYRINGE FOR IV PUSH (FOR BLOOD PRESSURE SUPPORT)
PREFILLED_SYRINGE | INTRAVENOUS | Status: AC
  Filled 2023-05-02: qty 10

## 2023-05-02 MED ORDER — PROPOFOL 10 MG/ML IV BOLUS
INTRAVENOUS | Status: AC
  Filled 2023-05-02: qty 20

## 2023-05-02 MED ORDER — OXYCODONE HCL 5 MG PO TABS
5.0000 mg | ORAL_TABLET | ORAL | Status: DC | PRN
  Administered 2023-05-02 – 2023-05-03 (×4): 5 mg via ORAL
  Filled 2023-05-02 (×4): qty 1

## 2023-05-02 MED ORDER — CEFAZOLIN SODIUM-DEXTROSE 2-4 GM/100ML-% IV SOLN
INTRAVENOUS | Status: AC
  Filled 2023-05-02: qty 100

## 2023-05-02 MED ORDER — PANTOPRAZOLE SODIUM 40 MG PO TBEC
40.0000 mg | DELAYED_RELEASE_TABLET | Freq: Two times a day (BID) | ORAL | Status: DC
Start: 2023-05-02 — End: 2023-05-03
  Administered 2023-05-02 – 2023-05-03 (×2): 40 mg via ORAL
  Filled 2023-05-02 (×2): qty 1

## 2023-05-02 MED ORDER — LISINOPRIL-HYDROCHLOROTHIAZIDE 10-12.5 MG PO TABS: 1.0000 | ORAL_TABLET | Freq: Every day | ORAL | Status: DC

## 2023-05-02 MED ORDER — ACETAMINOPHEN 10 MG/ML IV SOLN: 1000.0000 mg | Freq: Once | INTRAVENOUS | Status: DC | PRN

## 2023-05-02 MED ORDER — MENTHOL 3 MG MT LOZG: 1.0000 | LOZENGE | OROMUCOSAL | Status: DC | PRN

## 2023-05-02 MED ORDER — CHLORHEXIDINE GLUCONATE 0.12 % MT SOLN
OROMUCOSAL | Status: AC
  Filled 2023-05-02: qty 15

## 2023-05-02 MED ORDER — ACETAMINOPHEN 10 MG/ML IV SOLN
INTRAVENOUS | Status: AC
  Filled 2023-05-02: qty 100

## 2023-05-02 MED ORDER — LISINOPRIL 20 MG PO TABS
ORAL_TABLET | ORAL | Status: AC
  Filled 2023-05-02: qty 1

## 2023-05-02 MED ORDER — FERROUS SULFATE 325 (65 FE) MG PO TABS
325.0000 mg | ORAL_TABLET | Freq: Two times a day (BID) | ORAL | Status: DC
  Administered 2023-05-02 – 2023-05-03 (×2): 325 mg via ORAL
  Filled 2023-05-02 (×2): qty 1

## 2023-05-02 MED ORDER — RISAQUAD PO CAPS
1.0000 | ORAL_CAPSULE | Freq: Every day | ORAL | Status: DC
  Administered 2023-05-03: 1 via ORAL
  Filled 2023-05-02 (×2): qty 1

## 2023-05-02 MED ORDER — DIPHENHYDRAMINE HCL 12.5 MG/5ML PO ELIX: 12.5000 mg | ORAL_SOLUTION | ORAL | Status: DC | PRN

## 2023-05-02 MED ORDER — ASPIRIN 81 MG PO CHEW
81.0000 mg | CHEWABLE_TABLET | Freq: Two times a day (BID) | ORAL | Status: DC
  Administered 2023-05-02 – 2023-05-03 (×3): 81 mg via ORAL
  Filled 2023-05-02 (×3): qty 1

## 2023-05-02 MED ORDER — LORATADINE 10 MG PO TABS: 10.0000 mg | ORAL_TABLET | Freq: Every day | ORAL | Status: DC

## 2023-05-02 MED ORDER — LISINOPRIL 5 MG PO TABS
10.0000 mg | ORAL_TABLET | Freq: Every day | ORAL | Status: DC
  Administered 2023-05-02 – 2023-05-03 (×2): 10 mg via ORAL

## 2023-05-02 MED ORDER — DROPERIDOL 2.5 MG/ML IJ SOLN: 0.6250 mg | Freq: Once | INTRAMUSCULAR | Status: DC | PRN

## 2023-05-02 MED ORDER — MAGNESIUM HYDROXIDE 400 MG/5ML PO SUSP
30.0000 mL | Freq: Every day | ORAL | Status: DC
  Administered 2023-05-03: 30 mL via ORAL
  Filled 2023-05-02: qty 30

## 2023-05-02 MED ORDER — PROPOFOL 500 MG/50ML IV EMUL
INTRAVENOUS | Status: DC | PRN
  Administered 2023-05-02: 125 ug/kg/min via INTRAVENOUS

## 2023-05-02 MED ORDER — BISACODYL 10 MG RE SUPP: 10.0000 mg | Freq: Every day | RECTAL | Status: DC | PRN

## 2023-05-02 SURGICAL SUPPLY — 66 items
ATTUNE PSFEM RTSZ5 NARCEM KNEE (Femur) IMPLANT
ATTUNE PSRP INSR SZ5 5 KNEE (Insert) IMPLANT
BASE TIBIAL ROT PLAT SZ 5 KNEE (Knees) IMPLANT
BATTERY INSTRU NAVIGATION (MISCELLANEOUS) ×4 IMPLANT
BIT DRILL QUICK REL 1/8 2PK SL (BIT) ×1 IMPLANT
BLADE CLIPPER SURG (BLADE) IMPLANT
BLADE SAW 70X12.5 (BLADE) ×1 IMPLANT
BLADE SAW 90X13X1.19 OSCILLAT (BLADE) ×1 IMPLANT
BLADE SAW 90X25X1.19 OSCILLAT (BLADE) ×1 IMPLANT
BRUSH SCRUB EZ PLAIN DRY (MISCELLANEOUS) ×1 IMPLANT
CEMENT HV SMART SET (Cement) IMPLANT
COOLER POLAR GLACIER W/PUMP (MISCELLANEOUS) ×1 IMPLANT
CUFF TRNQT CYL 24X4X16.5-23 (TOURNIQUET CUFF) IMPLANT
DRAPE SHEET LG 3/4 BI-LAMINATE (DRAPES) ×1 IMPLANT
DRSG AQUACEL AG ADV 3.5X14 (GAUZE/BANDAGES/DRESSINGS) ×1 IMPLANT
DRSG MEPILEX SACRM 8.7X9.8 (GAUZE/BANDAGES/DRESSINGS) ×1 IMPLANT
DRSG TEGADERM 4X4.75 (GAUZE/BANDAGES/DRESSINGS) ×1 IMPLANT
DRSG XEROFORM 1X8 (GAUZE/BANDAGES/DRESSINGS) IMPLANT
DURAPREP 26ML APPLICATOR (WOUND CARE) ×2 IMPLANT
ELECT CAUTERY BLADE 6.4 (BLADE) ×1 IMPLANT
ELECT REM PT RETURN 9FT ADLT (ELECTROSURGICAL) ×1 IMPLANT
ELECTRODE REM PT RTRN 9FT ADLT (ELECTROSURGICAL) ×1 IMPLANT
EVACUATOR 1/8 PVC DRAIN (DRAIN) ×1 IMPLANT
EX-PIN ORTHOLOCK NAV 4X150 (PIN) ×2 IMPLANT
GAUZE XEROFORM 1X8 LF (GAUZE/BANDAGES/DRESSINGS) ×1 IMPLANT
GLOVE BIOGEL M STRL SZ7.5 (GLOVE) ×6 IMPLANT
GLOVE SURG UNDER POLY LF SZ8 (GLOVE) ×2 IMPLANT
GOWN STRL REUS W/ TWL LRG LVL3 (GOWN DISPOSABLE) ×1 IMPLANT
GOWN STRL REUS W/ TWL XL LVL3 (GOWN DISPOSABLE) ×1 IMPLANT
GOWN TOGA ZIPPER T7+ PEEL AWAY (MISCELLANEOUS) ×1 IMPLANT
HOLDER FOLEY CATH W/STRAP (MISCELLANEOUS) ×1 IMPLANT
HOOD PEEL AWAY T7 (MISCELLANEOUS) ×1 IMPLANT
KIT TURNOVER KIT A (KITS) ×1 IMPLANT
KNIFE SCULPS 14X20 (INSTRUMENTS) ×1 IMPLANT
MANIFOLD NEPTUNE II (INSTRUMENTS) ×2 IMPLANT
NDL SPNL 20GX3.5 QUINCKE YW (NEEDLE) ×2 IMPLANT
NEEDLE SPNL 20GX3.5 QUINCKE YW (NEEDLE) ×2 IMPLANT
PACK TOTAL KNEE (MISCELLANEOUS) ×1 IMPLANT
PAD ABD DERMACEA PRESS 5X9 (GAUZE/BANDAGES/DRESSINGS) ×2 IMPLANT
PAD ARMBOARD POSITIONER FOAM (MISCELLANEOUS) ×3 IMPLANT
PAD WRAPON POLAR KNEE (MISCELLANEOUS) ×1 IMPLANT
PATELLA MEDIAL ATTUN 35MM KNEE (Knees) IMPLANT
PENCIL SMOKE EVACUATOR COATED (MISCELLANEOUS) ×1 IMPLANT
PIN DRILL FIX HALF THREAD (BIT) ×2 IMPLANT
PIN FIXATION 1/8DIA X 3INL (PIN) ×1 IMPLANT
PULSAVAC PLUS IRRIG FAN TIP (DISPOSABLE) ×1 IMPLANT
SOL .9 NS 3000ML IRR UROMATIC (IV SOLUTION) ×1 IMPLANT
SOLUTION IRRIG SURGIPHOR (IV SOLUTION) ×1 IMPLANT
SPONGE DRAIN TRACH 4X4 STRL 2S (GAUZE/BANDAGES/DRESSINGS) ×1 IMPLANT
STAPLER SKIN PROX 35W (STAPLE) ×1 IMPLANT
STOCKINETTE IMPERV 14X48 (MISCELLANEOUS) ×1 IMPLANT
STOCKINETTE STRL BIAS CUT 8X4 (MISCELLANEOUS) ×1 IMPLANT
STRAP TIBIA SHORT (MISCELLANEOUS) ×1 IMPLANT
SUCTION TUBE FRAZIER 10FR DISP (SUCTIONS) ×1 IMPLANT
SUT VIC AB 0 CT1 36 (SUTURE) ×1 IMPLANT
SUT VIC AB 1 CT1 36 (SUTURE) ×2 IMPLANT
SUT VIC AB 2-0 CT2 27 (SUTURE) ×1 IMPLANT
SYR 30ML LL (SYRINGE) ×2 IMPLANT
TIBIAL BASE ROT PLAT SZ 5 KNEE (Knees) ×1 IMPLANT
TIP FAN IRRIG PULSAVAC PLUS (DISPOSABLE) ×1 IMPLANT
TOWEL OR 17X26 4PK STRL BLUE (TOWEL DISPOSABLE) ×1 IMPLANT
TOWER CARTRIDGE SMART MIX (DISPOSABLE) ×1 IMPLANT
TRAP FLUID SMOKE EVACUATOR (MISCELLANEOUS) ×1 IMPLANT
TRAY FOLEY MTR SLVR 16FR STAT (SET/KITS/TRAYS/PACK) ×1 IMPLANT
WATER STERILE IRR 1000ML POUR (IV SOLUTION) ×1 IMPLANT
WRAPON POLAR PAD KNEE (MISCELLANEOUS) ×1 IMPLANT

## 2023-05-02 NOTE — Plan of Care (Signed)
  Problem: Activity: Goal: Ability to avoid complications of mobility impairment will improve Outcome: Progressing Goal: Range of joint motion will improve Outcome: Progressing   Problem: Clinical Measurements: Goal: Postoperative complications will be avoided or minimized Outcome: Progressing   Problem: Pain Management: Goal: Pain level will decrease with appropriate interventions Outcome: Progressing   Problem: Skin Integrity: Goal: Will show signs of wound healing Outcome: Progressing   

## 2023-05-02 NOTE — Discharge Summary (Incomplete)
 Physician Discharge Summary  Subjective: 1 Day Post-Op Procedure(s) (LRB): ARTHROPLASTY, KNEE, TOTAL, USING IMAGELESS COMPUTER-ASSISTED NAVIGATION (Right) Patient reports pain as mild.   Patient seen in rounds with Dr. Aubry Blase. Patient is well, and has had no acute complaints or problems. Denies any CP, SOB, N/V, fevers or chills. Does report mild numbness in tingling in left foot but states she can move it well. We will start therapy today.  Patient is ready to go home  Physician Discharge Summary  Patient ID: Joanna Huffman MRN: [id number] DOB/AGE: 08/16/1975 48 y.o.  Admit date: 05/02/2023 Discharge date: 05/03/2023  Admission Diagnoses:  Discharge Diagnoses:  Principal Problem:   History of total knee arthroplasty, right   Discharged Condition: good  Hospital Course: Patient presented to the hospital on 05/02/2023 for an elective right total knee arthroplasty performed by Dr. Aubry Blase. Patient was given 1g of TXA and 2g of Ancef prior to the procedure. She  tolerated the procedure well without any complications. See procedural note below for details. Postoperatively, the patient did very well. She was able to pass PT protocols on post-op day one without any issues. Did report mild numbness in tingling in left foot but states she can move it well. JP drain was removed without any difficulty and was intact. she was able to void her bladder without any difficulty. Physical exam was unremarkable. she denies any SOB, CP, N/V, fevers or chills. Vital signs are stable. Patient is stable to discharge home.  PROCEDURE:  Right total knee arthroplasty using computer-assisted navigation   SURGEON:  Maxene Span. M.D.   ASSISTANT:  Benjiman Bras, PA-C (present and scrubbed throughout the case, critical for assistance with exposure, retraction, instrumentation, and closure)   ANESTHESIA: spinal   ESTIMATED BLOOD LOSS: 50 mL   FLUIDS REPLACED: 900 mL of crystalloid   TOURNIQUET TIME: 83  minutes   DRAINS: 2 medium Hemovac drains   SOFT TISSUE RELEASES: Anterior cruciate ligament, posterior cruciate ligament, deep medial collateral ligament, patellofemoral ligament   IMPLANTS UTILIZED: DePuy Attune size 5N posterior stabilized femoral component (cemented), size 5 rotating platform tibial component (cemented), 35 mm medialized dome patella (cemented), and a 5 mm stabilized rotating platform polyethylene insert.    Treatments: none  Discharge Exam: Blood pressure (!) 154/84, pulse 70, temperature 98.1 F (36.7 C), temperature source Oral, resp. rate 15, height 5' 3.5" (1.613 m), weight 76.2 kg, SpO2 100%.   Disposition: Home   Allergies as of 05/03/2023   No Known Allergies      Medication List     TAKE these medications    acetaminophen 500 MG tablet Commonly known as: TYLENOL Take 500-1,000 mg by mouth every 6 (six) hours as needed.   ascorbic acid 500 MG tablet Commonly known as: VITAMIN C Take 500 mg by mouth daily.   aspirin 81 MG chewable tablet Chew 1 tablet (81 mg total) by mouth 2 (two) times daily.   celecoxib 200 MG capsule Commonly known as: CELEBREX Take 1 capsule (200 mg total) by mouth 2 (two) times daily.   cetirizine 10 MG tablet Commonly known as: ZYRTEC Take 10 mg by mouth daily.   cholecalciferol 25 MCG (1000 UNIT) tablet Commonly known as: VITAMIN D3 Take 1,000 Units by mouth daily.   CHROMIUM PICOLATE PO Take 1 tablet by mouth daily.   diclofenac 75 MG EC tablet Commonly known as: VOLTAREN TAKE 1 TABLET BY MOUTH TWICE A DAY   Fish Oil Triple Strength 1400 MG Caps Take 1,400 mg  by mouth daily.   Levonorgest-Eth Est & Eth Est 42-21-21-7 DAYS Tabs Take 1 tablet by mouth daily.   lisinopril-hydrochlorothiazide 10-12.5 MG tablet Commonly known as: ZESTORETIC Take 1 tablet by mouth daily.   milk thistle 175 MG tablet Take 175 mg by mouth daily.   Multivitamin Women Tabs Take 1 tablet by mouth daily.   OSTEO  BI-FLEX ONE PER DAY PO Take 2 capsules by mouth daily.   oxyCODONE 5 MG immediate release tablet Commonly known as: Oxy IR/ROXICODONE Take 1 tablet (5 mg total) by mouth every 4 (four) hours as needed for moderate pain (pain score 4-6) (pain score 4-6).   PROBIOTIC DAILY PO Take 1 capsule by mouth daily.   traMADol 50 MG tablet Commonly known as: ULTRAM Take 1-2 tablets (50-100 mg total) by mouth every 4 (four) hours as needed for moderate pain (pain score 4-6).               Durable Medical Equipment  (From admission, onward)           Start     Ordered   05/02/23 1047  DME Walker rolling  Once       Question:  Patient needs a walker to treat with the following condition  Answer:  Total knee replacement status   05/02/23 1046   05/02/23 1047  DME Bedside commode  Once       Comments: Patient is not able to walk the distance required to go the bathroom, or he/she is unable to safely negotiate stairs required to access the bathroom.  A 3in1 BSC will alleviate this problem  Question:  Patient needs a bedside commode to treat with the following condition  Answer:  Total knee replacement status   05/02/23 1046            Follow-up Information     Wadie Guile, PA-C Follow up on 05/17/2023.   Specialty: Orthopedic Surgery Why: at 10:15am Contact information: [address] [place] [address] [phone]         Arlyne Lame, MD Follow up on 06/14/2023.   Specialty: Orthopedic Surgery Why: at 11:30am Contact information: [address] [facility] [place] [address] [phone]                 Signed: Benjiman Bras 05/03/2023, 8:25 AM   Objective: Vital signs in last 24 hours: Temp:  [97.1 F (36.2 C)-98.4 F (36.9 C)] 98.1 F (36.7 C) (04/15 0804) Pulse Rate:  [60-83] 70 (04/15 0804) Resp:  [14-21] 15 (04/15 0804) BP: (107-154)/(69-89) 154/84 (04/15 0804) SpO2:  [97 %-100 %] 100 % (04/15  0804)  Intake/Output from previous day:  Intake/Output Summary (Last 24 hours) at 05/03/2023 0825 Last data filed at 05/03/2023 0416 Gross per 24 hour  Intake 2516.54 ml  Output 1730 ml  Net 786.54 ml    Intake/Output this shift: No intake/output data recorded.  Labs: No results for input(s): "HGB" in the last 72 hours. No results for input(s): "WBC", "RBC", "HCT", "PLT" in the last 72 hours. No results for input(s): "NA", "K", "CL", "CO2", "BUN", "CREATININE", "GLUCOSE", "CALCIUM" in the last 72 hours. No results for input(s): "LABPT", "INR" in the last 72 hours.  EXAM: General - Patient is Alert, Appropriate, and Oriented Extremity - Neurologically intact Neurovascular intact Sensation intact distally Intact pulses distally Dorsiflexion/Plantar flexion intact No cellulitis present Compartment soft Dressing - dressing C/D/I and no drainage Motor Function - intact, moving foot and toes  well on exam. JP Drain pulled without difficulty. Intact  Assessment/Plan: 1 Day Post-Op Procedure(s) (LRB): ARTHROPLASTY, KNEE, TOTAL, USING IMAGELESS COMPUTER-ASSISTED NAVIGATION (Right) Procedure(s) (LRB): ARTHROPLASTY, KNEE, TOTAL, USING IMAGELESS COMPUTER-ASSISTED NAVIGATION (Right) Past Medical History:  Diagnosis Date   Arthritis    L4-5, knees, maybe hands   Dysrhythmia    Pt states occasional pounding heart   Hypertension    Seasonal allergies    Principal Problem:   History of total knee arthroplasty, right  Estimated body mass index is 29.29 kg/m as calculated from the following:   Height as of this encounter: 5' 3.5" (1.613 m).   Weight as of this encounter: 76.2 kg.  Patient will continue to work with physical therapy   Discussed with the patient continuing to utilize Polar Care   Patient will use bone foam in 20-30 minute intervals   Patient will wear TED hose bilaterally to help prevent DVT and clot formation   Discussed the Aquacel bandage.  This bandage will  stay in place 7 days postoperatively.  Can be replaced with honeycomb bandages that will be sent home with the patient   Discussed sending the patient home with tramadol and oxycodone for as needed pain management.  Patient will also be sent home with Celebrex to help with swelling and inflammation.  Patient will take an 81 mg aspirin twice daily for DVT prophylaxis   JP drain removed without difficulty, intact   Weight-Bearing as tolerated to right leg   Patient will follow-up with [facility] orthopedics in 2 weeks for staple removal and reevaluation  Diet - Regular diet Follow up - in 2 weeks Activity - WBAT Disposition - Home Condition Upon Discharge - Good DVT Prophylaxis - Aspirin and TED hose  Standley Earing, PA-C Orthopaedic Surgery 05/03/2023, 8:25 AM

## 2023-05-02 NOTE — Progress Notes (Incomplete)
 Subjective: 1 Day Post-Op Procedure(s) (LRB): ARTHROPLASTY, KNEE, TOTAL, USING IMAGELESS COMPUTER-ASSISTED NAVIGATION (Right) Patient reports pain as mild.   Patient seen in rounds with Dr. Aubry Blase. Patient is well, and has had no acute complaints or problems. Does report mild numbness in tingling in left foot but states she can move it well. Denies any CP, SOB, N/V, fevers or chills. We will start therapy today.  Plan is to go Home after hospital stay.  Objective: Vital signs in last 24 hours: Temp:  [97.1 F (36.2 C)-98.4 F (36.9 C)] 98.1 F (36.7 C) (04/15 0804) Pulse Rate:  [60-83] 70 (04/15 0804) Resp:  [14-21] 15 (04/15 0804) BP: (107-154)/(69-89) 154/84 (04/15 0804) SpO2:  [97 %-100 %] 100 % (04/15 0804)  Intake/Output from previous day:  Intake/Output Summary (Last 24 hours) at 05/03/2023 0825 Last data filed at 05/03/2023 0416 Gross per 24 hour  Intake 2516.54 ml  Output 1730 ml  Net 786.54 ml    Intake/Output this shift: No intake/output data recorded.  Labs: No results for input(s): "HGB" in the last 72 hours. No results for input(s): "WBC", "RBC", "HCT", "PLT" in the last 72 hours. No results for input(s): "NA", "K", "CL", "CO2", "BUN", "CREATININE", "GLUCOSE", "CALCIUM" in the last 72 hours. No results for input(s): "LABPT", "INR" in the last 72 hours.  EXAM General - Patient is Alert, Appropriate, and Oriented Extremity - Neurologically intact Neurovascular intact Sensation intact distally Intact pulses distally Dorsiflexion/Plantar flexion intact No cellulitis present Compartment soft Dressing - dressing C/D/I and no drainage Motor Function - intact, moving foot and toes well on exam. JP Drain pulled without difficulty. Intact  Past Medical History:  Diagnosis Date   Arthritis    L4-5, knees, maybe hands   Dysrhythmia    Pt states occasional pounding heart   Hypertension    Seasonal allergies     Assessment/Plan: 1 Day Post-Op Procedure(s)  (LRB): ARTHROPLASTY, KNEE, TOTAL, USING IMAGELESS COMPUTER-ASSISTED NAVIGATION (Right) Principal Problem:   History of total knee arthroplasty, right  Estimated body mass index is 29.29 kg/m as calculated from the following:   Height as of this encounter: 5' 3.5" (1.613 m).   Weight as of this encounter: 76.2 kg. Advance diet Up with therapy  Patient will continue to work with physical therapy to pass postoperative PT protocols, ROM and strengthening  Discussed with the patient continuing to utilize Polar Care  Patient will use bone foam in 20-30 minute intervals  Patient will wear TED hose bilaterally to help prevent DVT and clot formation  Discussed the Aquacel bandage.  This bandage will stay in place 7 days postoperatively.  Can be replaced with honeycomb bandages that will be sent home with the patient  Discussed sending the patient home with tramadol and oxycodone for as needed pain management.  Patient will also be sent home with Celebrex to help with swelling and inflammation.  Patient will take an 81 mg aspirin twice daily for DVT prophylaxis  JP drain removed without difficulty, intact  Weight-Bearing as tolerated to right leg  Patient will follow-up with [facility] orthopedics in 2 weeks for staple removal and reevaluation  Wadie Guile, PA-C [facility] Orthopaedics 05/03/2023, 8:25 AM

## 2023-05-02 NOTE — Interval H&P Note (Signed)
 History and Physical Interval Note:  05/02/2023 6:44 AM  Joanna Huffman  has presented today for surgery, with the diagnosis of Primary osteoarthritis of right knee M17.11.  The various methods of treatment have been discussed with the patient and family. After consideration of risks, benefits and other options for treatment, the patient has consented to  Procedure(s): ARTHROPLASTY, KNEE, TOTAL, USING IMAGELESS COMPUTER-ASSISTED NAVIGATION (Right) as a surgical intervention.  The patient's history has been reviewed, patient examined, no change in status, stable for surgery.  I have reviewed the patient's chart and labs.  Questions were answered to the patient's satisfaction.     Sylvana Bonk P Heru Montz

## 2023-05-02 NOTE — Transfer of Care (Signed)
 Immediate Anesthesia Transfer of Care Note  Patient: Joanna Huffman  Procedure(s) Performed: ARTHROPLASTY, KNEE, TOTAL, USING IMAGELESS COMPUTER-ASSISTED NAVIGATION (Right: Knee)  Patient Location: PACU  Anesthesia Type:MAC and Spinal  Level of Consciousness: awake, alert , oriented, and patient cooperative  Airway & Oxygen Therapy: Patient Spontanous Breathing  Post-op Assessment: Report given to RN and Post -op Vital signs reviewed and stable  Post vital signs: Reviewed and stable  Last Vitals:  Vitals Value Taken Time  BP 107/69 05/02/23 1031  Temp 36.9 C 05/02/23 1031  Pulse 87 05/02/23 1033  Resp 16 05/02/23 1033  SpO2 97 % 05/02/23 1033  Vitals shown include unfiled device data.  Last Pain:  Vitals:   05/02/23 0627  TempSrc: Temporal  PainSc: 3          Complications: There were no known notable events for this encounter.

## 2023-05-02 NOTE — Anesthesia Procedure Notes (Addendum)
 Spinal  Patient location during procedure: OR Start time: 05/02/2023 7:09 AM End time: 05/02/2023 7:13 AM Reason for block: surgical anesthesia Staffing Performed: resident/CRNA  Anesthesiologist: Baltazar Bonier, MD Resident/CRNA: Aniceto Kern, RN Performed by: Aniceto Kern, RN Authorized by: Baltazar Bonier, MD   Preanesthetic Checklist Completed: patient identified, IV checked, site marked, risks and benefits discussed, surgical consent, monitors and equipment checked, pre-op evaluation and timeout performed Spinal Block Patient position: sitting Prep: DuraPrep Patient monitoring: heart rate, cardiac monitor, continuous pulse ox and blood pressure Approach: midline Location: L2-3 Injection technique: single-shot Needle Needle type: Sprotte and Quincke  Needle gauge: 22 G Needle length: 9 cm Assessment Sensory level: T4 Events: CSF return

## 2023-05-02 NOTE — Progress Notes (Signed)
 Patient is not able to walk the distance required to go the bathroom, or he/she is unable to safely negotiate stairs required to access the bathroom.  A 3in1 BSC will alleviate this problem   Amenda Duclos P. Angie Fava M.D.

## 2023-05-02 NOTE — Evaluation (Signed)
 Physical Therapy Evaluation Patient Details Name: Joanna Huffman MRN: [id number] DOB: 04-20-1975 Today's Date: 05/02/2023  History of Present Illness  Shloka Baldridge is a 48 y.o. year old female with a long history of progressive knee pain. Patient is s/p R TKA. At time of evaluation, pt is POD 0.  Clinical Impression  Patient received supine in bed upon arrival, agreeable to PT evaluation. At time of evaluation, patient is s/p R TKA, POD 0. Patient received pain medication prior to evaluation. Patient lives with husband, in single level home with 4-5 STE. PT reviewed handout, including precautions, positioning, and HEP with patient able to demonstrate all properly with minimal cues. Patient R Knee ROM -4 to 69 degrees. Patient able to complete bed mobility MOD I, no physical assist required but increased time. Patient able to stand from EOB, with RW and initial CGA progressing to supervision with additional reps. Patient able to ambulate x 40 ft, then x 25 ft. Limited due to lightheadedness, but BP stable. Pt able to stand from toilet, complete peri care IND with supervision for balance. Patient tolerating activities with moderate pain 5/10. RN Notified of mobility status, left in bed with all needs in reach and husband at bedside. Patient will benefit from skilled acute PT services to address functional impairments (see below for additional) and maximize functional mobility. Anticipate the need for follow up PT services upon acute hospital discharge. Will continue to follow acutely.         If plan is discharge home, recommend the following: A little help with walking and/or transfers;Assist for transportation;Help with stairs or ramp for entrance   Can travel by private vehicle        Equipment Recommendations None recommended by PT  Recommendations for Other Services       Functional Status Assessment Patient has had a recent decline in their functional status and demonstrates the ability to make  significant improvements in function in a reasonable and predictable amount of time.     Precautions / Restrictions Precautions Precautions: Knee Precaution Booklet Issued: Yes (comment) Restrictions Weight Bearing Restrictions Per Provider Order: Yes RLE Weight Bearing Per Provider Order: Weight bearing as tolerated      Mobility  Bed Mobility Overal bed mobility: Modified Independent             General bed mobility comments: MOD I with supine <> sit. No assistance, increased time required. Denies lightheadedness seated EOB.    Transfers Overall transfer level: Needs assistance Equipment used: Rolling walker (2 wheels) Transfers: Sit to/from Stand Sit to Stand: Contact guard assist, Supervision           General transfer comment: Pt able to stand from EOB with CGA on initial STS. Patient supervision on additional attempts from surfaces including toilet.    Ambulation/Gait Ambulation/Gait assistance: Contact guard assist Gait Distance (Feet): 40 Feet (; plus additional 25 ft.) Assistive device: Rolling walker (2 wheels) Gait Pattern/deviations: Step-through pattern, Decreased step length - right, Decreased stance time - right Gait velocity: Decreased     General Gait Details: Pt able to ambulate RW x 40 ft, then additional 25 ft. Pt lightheaded, BP stable on assessment.  Stairs            Wheelchair Mobility     Tilt Bed    Modified Rankin (Stroke Patients Only)       Balance Overall balance assessment: Needs assistance Sitting-balance support: No upper extremity supported, Feet supported Sitting balance-Leahy Scale: Normal  Standing balance support: During functional activity, Bilateral upper extremity supported Standing balance-Leahy Scale: Good Standing balance comment: good standing balance, patient able to maintain balance without UE support.                             Pertinent Vitals/Pain Pain Assessment Pain  Assessment: 0-10 Pain Score: 5  Pain Location: R Knee Pain Descriptors / Indicators: Heaviness, Tender, Aching Pain Intervention(s): Limited activity within patient's tolerance, Monitored during session, Premedicated before session    Home Living Family/patient expects to be discharged to:: Private residence Living Arrangements: Spouse/significant other;Children Available Help at Discharge: Family Type of Home: House Home Access: Stairs to enter Entrance Stairs-Rails: None (front entrance; side entrance: 5-6, with rail on L) Entrance Stairs-Number of Steps: 3   Home Layout: One level Home Equipment: Agricultural consultant (2 wheels)      Prior Function Prior Level of Function : Independent/Modified Independent             Mobility Comments: IND with mobility; working part time of Producer, television/film/video. Drives. Was still active, went to gym 3-4x/week ADLs Comments: IND with ADLs     Extremity/Trunk Assessment   Upper Extremity Assessment Upper Extremity Assessment: Overall WFL for tasks assessed    Lower Extremity Assessment Lower Extremity Assessment: RLE deficits/detail RLE Deficits / Details: mild weakness noted in RLE s/p surgery; no formal MMT completed RLE Sensation: WNL RLE Coordination: WNL    Cervical / Trunk Assessment Cervical / Trunk Assessment: Normal  Communication   Communication Communication: No apparent difficulties    Cognition Arousal: Alert Behavior During Therapy: WFL for tasks assessed/performed   PT - Cognitive impairments: No apparent impairments                         Following commands: Intact       Cueing       General Comments      Exercises Total Joint Exercises Heel Slides: AROM, Right, 5 reps Straight Leg Raises: AROM, Strengthening, Right, 10 reps Knee Flexion: AROM, Strengthening, Right, 10 reps Goniometric ROM: -4 to 69   Assessment/Plan    PT Assessment Patient needs continued PT services  PT Problem List          PT Treatment Interventions DME instruction;Gait training;Stair training;Functional mobility training;Therapeutic activities;Therapeutic exercise;Balance training;Manual techniques    PT Goals (Current goals can be found in the Care Plan section)  Acute Rehab PT Goals Patient Stated Goal: Get Back on My Feet Pain Free PT Goal Formulation: With patient Time For Goal Achievement: 05/16/23 Potential to Achieve Goals: Good    Frequency BID     Co-evaluation               AM-PAC PT "6 Clicks" Mobility  Outcome Measure Help needed turning from your back to your side while in a flat bed without using bedrails?: None Help needed moving from lying on your back to sitting on the side of a flat bed without using bedrails?: None Help needed moving to and from a bed to a chair (including a wheelchair)?: A Little Help needed standing up from a chair using your arms (e.g., wheelchair or bedside chair)?: A Little Help needed to walk in hospital room?: A Little Help needed climbing 3-5 steps with a railing? : A Lot 6 Click Score: 19    End of Session Equipment Utilized During Treatment: Gait belt Activity Tolerance: Patient  tolerated treatment well Patient left: in bed;with family/visitor present Nurse Communication: Mobility status PT Visit Diagnosis: Other abnormalities of gait and mobility (R26.89);Pain Pain - Right/Left: Right Pain - part of body: Knee    Time: 1440-1520 PT Time Calculation (min) (ACUTE ONLY): 40 min   Charges:   PT Evaluation $PT Eval Low Complexity: 1 Low PT Treatments $Therapeutic Exercise: 8-22 mins PT General Charges $$ ACUTE PT VISIT: 1 Visit         Donnie Galea, PT, DPT 05/02/23 3:47 PM

## 2023-05-02 NOTE — Plan of Care (Signed)
  Problem: Activity: Goal: Ability to avoid complications of mobility impairment will improve 05/02/2023 1719 by Margarita Shear D, RN Outcome: Progressing 05/02/2023 1529 by Margarita Shear D, RN Outcome: Progressing Goal: Range of joint motion will improve 05/02/2023 1719 by Cathlyn Coast, RN Outcome: Progressing 05/02/2023 1529 by Margarita Shear D, RN Outcome: Progressing   Problem: Clinical Measurements: Goal: Postoperative complications will be avoided or minimized 05/02/2023 1719 by Margarita Shear D, RN Outcome: Progressing 05/02/2023 1529 by Margarita Shear D, RN Outcome: Progressing   Problem: Pain Management: Goal: Pain level will decrease with appropriate interventions 05/02/2023 1719 by Cathlyn Coast, RN Outcome: Progressing 05/02/2023 1529 by Margarita Shear D, RN Outcome: Progressing   Problem: Skin Integrity: Goal: Will show signs of wound healing 05/02/2023 1719 by Margarita Shear D, RN Outcome: Progressing 05/02/2023 1529 by Margarita Shear D, RN Outcome: Progressing   Problem: Activity: Goal: Risk for activity intolerance will decrease Outcome: Progressing   Problem: Nutrition: Goal: Adequate nutrition will be maintained Outcome: Progressing

## 2023-05-02 NOTE — Op Note (Signed)
 OPERATIVE NOTE  DATE OF SURGERY:  05/02/2023  PATIENT NAME:  Joanna Huffman   DOB: [birth year]  MRN: [id number]  PRE-OPERATIVE DIAGNOSIS: Degenerative arthrosis of the right knee, primary  POST-OPERATIVE DIAGNOSIS:  Same  PROCEDURE:  Right total knee arthroplasty using computer-assisted navigation  SURGEON:  Jena Gauss. M.D.  ASSISTANT:  Gean Birchwood, PA-C (present and scrubbed throughout the case, critical for assistance with exposure, retraction, instrumentation, and closure)  ANESTHESIA: spinal  ESTIMATED BLOOD LOSS: 50 mL  FLUIDS REPLACED: 900 mL of crystalloid  TOURNIQUET TIME: 83 minutes  DRAINS: 2 medium Hemovac drains  SOFT TISSUE RELEASES: Anterior cruciate ligament, posterior cruciate ligament, deep medial collateral ligament, patellofemoral ligament  IMPLANTS UTILIZED: DePuy Attune size 5N posterior stabilized femoral component (cemented), size 5 rotating platform tibial component (cemented), 35 mm medialized dome patella (cemented), and a 5 mm stabilized rotating platform polyethylene insert.  INDICATIONS FOR SURGERY: Joanna Huffman is a 48 y.o. year old female with a long history of progressive knee pain. X-rays demonstrated severe degenerative changes in tricompartmental fashion. The patient had not seen any significant improvement despite conservative nonsurgical intervention. After discussion of the risks and benefits of surgical intervention, the patient expressed understanding of the risks benefits and agree with plans for total knee arthroplasty.   The risks, benefits, and alternatives were discussed at length including but not limited to the risks of infection, bleeding, nerve injury, stiffness, blood clots, the need for revision surgery, cardiopulmonary complications, among others, and they were willing to proceed.  PROCEDURE IN DETAIL: The patient was brought into the operating room and, after adequate spinal anesthesia was achieved, a tourniquet was placed on  the patient's upper thigh. The patient's knee and leg were cleaned and prepped with alcohol and DuraPrep and draped in the usual sterile fashion. A "timeout" was performed as per usual protocol. The lower extremity was exsanguinated using an Esmarch, and the tourniquet was inflated to 300 mmHg. An anterior longitudinal incision was made followed by a standard mid vastus approach. The deep fibers of the medial collateral ligament were elevated in a subperiosteal fashion off of the medial flare of the tibia so as to maintain a continuous soft tissue sleeve. The patella was subluxed laterally and the patellofemoral ligament was incised. Inspection of the knee demonstrated severe degenerative changes with full-thickness loss of articular cartilage. Osteophytes were debrided using a rongeur. Anterior and posterior cruciate ligaments were excised. Two 4.0 mm Schanz pins were inserted in the femur and into the tibia for attachment of the array of trackers used for computer-assisted navigation. Hip center was identified using a circumduction technique. Distal landmarks were mapped using the computer. The distal femur and proximal tibia were mapped using the computer. The distal femoral cutting guide was positioned using computer-assisted navigation so as to achieve a 5 distal valgus cut. The femur was sized and it was felt that a size 5N femoral component was appropriate. A size 5 femoral cutting guide was positioned and the anterior cut was performed and verified using the computer. This was followed by completion of the posterior and chamfer cuts. Femoral cutting guide for the central box was then positioned in the center box cut was performed.  Attention was then directed to the proximal tibia. Medial and lateral menisci were excised. The extramedullary tibial cutting guide was positioned using computer-assisted navigation so as to achieve a 0 varus-valgus alignment and 3 posterior slope. The cut was performed and  verified using the computer. The proximal tibia was sized  and it was felt that a size 5 tibial tray was appropriate. Tibial and femoral trials were inserted followed by insertion of a 5 mm polyethylene insert. This allowed for excellent mediolateral soft tissue balancing both in flexion and in full extension. Finally, the patella was cut and prepared so as to accommodate a 35 mm medialized dome patella. A patella trial was placed and the knee was placed through a range of motion with excellent patellar tracking appreciated. The femoral trial was removed after debridement of posterior osteophytes. The central post-hole for the tibial component was reamed followed by insertion of a keel punch. Tibial trials were then removed. Cut surfaces of bone were irrigated with copious amounts of normal saline using pulsatile lavage and then suctioned dry. Polymethylmethacrylate cement was prepared in the usual fashion using a vacuum mixer. Cement was applied to the cut surface of the proximal tibia as well as along the undersurface of a size 5 rotating platform tibial component. Tibial component was positioned and impacted into place. Excess cement was removed using Personal assistant. Cement was then applied to the cut surfaces of the femur as well as along the posterior flanges of the size 5N femoral component. The femoral component was positioned and impacted into place. Excess cement was removed using Personal assistant. A 5 mm polyethylene trial was inserted and the knee was brought into full extension with steady axial compression applied. Finally, cement was applied to the backside of a 35 mm medialized dome patella and the patellar component was positioned and patellar clamp applied. Excess cement was removed using Personal assistant. After adequate curing of the cement, the tourniquet was deflated after a total tourniquet time of 83 minutes. Hemostasis was achieved using electrocautery. The knee was irrigated with copious  amounts of normal saline using pulsatile lavage followed by 450 ml of Surgiphor and then suctioned dry. 20 mL of 1.3% Exparel and 60 mL of 0.25% Marcaine in 40 mL of normal saline was injected along the posterior capsule, medial and lateral gutters, and along the arthrotomy site. A 5 mm stabilized rotating platform polyethylene insert was inserted and the knee was placed through a range of motion with excellent mediolateral soft tissue balancing appreciated and excellent patellar tracking noted. 2 medium drains were placed in the wound bed and brought out through separate stab incisions. The medial parapatellar portion of the incision was reapproximated using interrupted sutures of #1 Vicryl. Subcutaneous tissue was approximated in layers using first #0 Vicryl followed #2-0 Vicryl. The skin was approximated with skin staples. A sterile dressing was applied.  The patient tolerated the procedure well and was transported to the recovery room in stable condition.    Sherise Geerdes P. Joley Utecht, Jr., M.D.

## 2023-05-03 ENCOUNTER — Encounter: Payer: Self-pay | Admitting: Orthopedic Surgery

## 2023-05-03 DIAGNOSIS — M1711 Unilateral primary osteoarthritis, right knee: Secondary | ICD-10-CM | POA: Diagnosis not present

## 2023-05-03 MED ORDER — OXYCODONE HCL 5 MG PO TABS: 5.0000 mg | ORAL_TABLET | ORAL | 0 refills | Status: AC | PRN

## 2023-05-03 MED ORDER — CELECOXIB 200 MG PO CAPS: 200.0000 mg | ORAL_CAPSULE | Freq: Two times a day (BID) | ORAL | 1 refills | Status: DC

## 2023-05-03 MED ORDER — LISINOPRIL 5 MG PO TABS
ORAL_TABLET | ORAL | Status: AC
  Filled 2023-05-03: qty 1

## 2023-05-03 MED ORDER — ASPIRIN 81 MG PO CHEW: 81.0000 mg | CHEWABLE_TABLET | Freq: Two times a day (BID) | ORAL | Status: AC

## 2023-05-03 MED ORDER — OXYCODONE HCL 5 MG PO TABS: 5.0000 mg | ORAL_TABLET | ORAL | 0 refills | Status: DC | PRN

## 2023-05-03 MED ORDER — CELECOXIB 200 MG PO CAPS: 200.0000 mg | ORAL_CAPSULE | Freq: Two times a day (BID) | ORAL | 1 refills | Status: AC

## 2023-05-03 MED ORDER — TRAMADOL HCL 50 MG PO TABS: 50.0000 mg | ORAL_TABLET | ORAL | 0 refills | Status: DC | PRN

## 2023-05-03 MED ORDER — TRAMADOL HCL 50 MG PO TABS
50.0000 mg | ORAL_TABLET | ORAL | 0 refills | Status: AC | PRN
Start: 2023-05-03 — End: ?

## 2023-05-03 MED ORDER — ACETAMINOPHEN 10 MG/ML IV SOLN
INTRAVENOUS | Status: AC
  Filled 2023-05-03: qty 100

## 2023-05-03 NOTE — Progress Notes (Signed)
 DISCHARGE NOTE:  Pt dc with IV removed and TED hose on and in place. Pt given 2 honeycomb dressings, mediation  scripts, polar care, and bone foam. Pt 3 in 1 delivered to hospital room. Pt wheeled down to medical mall entrance by staff and pt's husband provided transportation.

## 2023-05-03 NOTE — Progress Notes (Signed)
 Physical Therapy Treatment Patient Details Name: Joanna Huffman MRN: [id number] DOB: 09-Oct-1975 Today's Date: 05/03/2023   History of Present Illness Demiana Crumbley is a 48 y.o. year old female with a long history of progressive knee pain. Patient is s/p R TKA. At time of evaluation, pt is POD 0.    PT Comments  Pt session complete.  All goals met.  Questions and concerns discussed.  No further acute PT needs at this time.  Awaiting DC home.   If plan is discharge home, recommend the following: Assist for transportation;Help with stairs or ramp for entrance   Can travel by private vehicle        Equipment Recommendations  None recommended by PT    Recommendations for Other Services       Precautions / Restrictions Precautions Precautions: Knee Precaution Booklet Issued: Yes (comment) Restrictions Weight Bearing Restrictions Per Provider Order: Yes RLE Weight Bearing Per Provider Order: Weight bearing as tolerated     Mobility  Bed Mobility Overal bed mobility: Modified Independent               Patient Response: Cooperative  Transfers Overall transfer level: Modified independent                      Ambulation/Gait Ambulation/Gait assistance: Modified independent (Device/Increase time) Gait Distance (Feet): 200 Feet Assistive device: Rolling walker (2 wheels) Gait Pattern/deviations: Step-through pattern, Decreased step length - right, Decreased stance time - right Gait velocity: Decreased         Stairs Stairs: Yes Stairs assistance: Supervision Stair Management: Two rails, Forwards Number of Stairs: 4 General stair comments: discussed with rails and curb type steps with RW as she has at home   Wheelchair Mobility     Tilt Bed Tilt Bed Patient Response: Cooperative  Modified Rankin (Stroke Patients Only)       Balance Overall balance assessment: Modified Independent                                           Communication Communication Communication: No apparent difficulties  Cognition Arousal: Alert Behavior During Therapy: WFL for tasks assessed/performed   PT - Cognitive impairments: No apparent impairments                         Following commands: Intact      Cueing Cueing Techniques: Verbal cues  Exercises Total Joint Exercises Goniometric ROM: 0-90 =self stretch Other Exercises Other Exercises: HEP demo and review of handouts    General Comments        Pertinent Vitals/Pain Pain Assessment Pain Assessment: Faces Faces Pain Scale: Hurts little more Pain Location: R Knee Pain Descriptors / Indicators: Sore, Aching Pain Intervention(s): Limited activity within patient's tolerance, Monitored during session, Premedicated before session    Home Living                          Prior Function            PT Goals (current goals can now be found in the care plan section) Progress towards PT goals: Progressing toward goals    Frequency    BID      PT Plan      Co-evaluation  AM-PAC PT "6 Clicks" Mobility   Outcome Measure  Help needed turning from your back to your side while in a flat bed without using bedrails?: None Help needed moving from lying on your back to sitting on the side of a flat bed without using bedrails?: None Help needed moving to and from a bed to a chair (including a wheelchair)?: None Help needed standing up from a chair using your arms (e.g., wheelchair or bedside chair)?: None Help needed to walk in hospital room?: None Help needed climbing 3-5 steps with a railing? : A Little 6 Click Score: 23    End of Session Equipment Utilized During Treatment: Gait belt Activity Tolerance: Patient tolerated treatment well Patient left: in bed;with family/visitor present Nurse Communication: Mobility status PT Visit Diagnosis: Other abnormalities of gait and mobility (R26.89);Pain Pain - Right/Left:  Right Pain - part of body: Knee     Time: 4098-1191 PT Time Calculation (min) (ACUTE ONLY): 18 min  Charges:    $Gait Training: 8-22 mins PT General Charges $$ ACUTE PT VISIT: 1 Visit                   Charlanne Cong, PTA 05/03/23, 8:54 AM

## 2023-05-03 NOTE — Plan of Care (Signed)
  Problem: Activity: Goal: Ability to avoid complications of mobility impairment will improve Outcome: Progressing   Problem: Clinical Measurements: Goal: Postoperative complications will be avoided or minimized Outcome: Progressing   Problem: Education: Goal: Knowledge of General Education information will improve Description: Including pain rating scale, medication(s)/side effects and non-pharmacologic comfort measures Outcome: Progressing   Problem: Activity: Goal: Risk for activity intolerance will decrease Outcome: Progressing

## 2023-05-03 NOTE — Progress Notes (Signed)
 OT Cancellation Note  Patient Details Name: Joanna Huffman MRN: [id number] DOB: Oct 12, 1975   Cancelled Treatment:    Reason Eval/Treat Not Completed: OT screened, no needs identified, will sign off. Order received, chart reviewed. On arrival pt and RN endorse no skilled acute OT needs. Strong family support at bedside, good recall of polar care mgmt and adapted dressing needs. Will sign off. Please re-consult if additional needs arise.   Gordan Latina, M.S. OTR/L  05/03/23, 9:42 AM  ascom [phone]

## 2023-05-03 NOTE — TOC Initial Note (Signed)
 Transition of Care [facility]) - Initial/Assessment Note    Patient Details  Name: Joanna Huffman MRN: [id number] Date of Birth: 1975/04/27  Transition of [facility]) CM/SW Contact:    Marlowe Sax, RN Phone Number: 05/03/2023, 8:37 AM  Clinical Narrative:                  [facility] is set up for Home health services prior to surgery by surgeons office  Expected Discharge Plan: Home w Home Health Services Barriers to Discharge: Barriers Resolved   Patient Goals and CMS Choice            Expected Discharge Plan and Services   Discharge Planning Services: CM Consult   Living arrangements for the past 2 months: Single Family Home Expected Discharge Date: 05/03/23               DME Arranged: N/A DME Agency: NA       HH Arranged: PT, OT HH Agency: [facility] Date HH Agency Contacted: 05/03/23 Time HH Agency Contacted: [phone] Representative spoke with at [facility] Agency: [place]  Prior Living Arrangements/Services Living arrangements for the past 2 months: Single Family Home Lives with:: Spouse Patient language and need for interpreter reviewed:: Yes Do you feel safe going back to the place where you live?: Yes      Need for Family Participation in Patient Care: Yes (Comment) Care giver support system in place?: Yes (comment) Current home services: DME (rolling walker) Criminal Activity/Legal Involvement Pertinent to Current Situation/Hospitalization: No - Comment as needed  Activities of Daily Living   ADL Screening (condition at time of admission) Independently performs ADLs?: Yes (appropriate for developmental age) Is the patient deaf or have difficulty hearing?: No Does the patient have difficulty seeing, even when wearing glasses/contacts?: No Does the patient have difficulty concentrating, remembering, or making decisions?: No  Permission Sought/Granted   Permission granted to share information with : Yes, Verbal Permission Granted               Emotional Assessment Appearance:: Appears stated age Attitude/Demeanor/Rapport: Engaged Affect (typically observed): Pleasant Orientation: : Oriented to Self, Oriented to Place, Oriented to  Time, Oriented to Situation Alcohol / Substance Use: Not Applicable Psych Involvement: No (comment)  Admission diagnosis:  Primary osteoarthritis of right knee [M17.11] History of total knee arthroplasty, right [Z96.651] Patient Active Problem List   Diagnosis Date Noted   History of total knee arthroplasty, right 05/02/2023   Primary osteoarthritis of right knee 02/27/2023   Encounter for screening colonoscopy 03/08/2022   Current mild episode of major depressive disorder (HCC) 09/27/2017   Screening for breast cancer 06/29/2016   Blood pressure elevated without history of HTN 05/10/2016   Asymptomatic PVCs 05/10/2016   PCP:  Duanne Limerick, MD Pharmacy:   [facility] [address] [place] - [address] [place] [address] Phone: [phone] Fax: [phone]     Social Drivers of Health (SDOH) Social History: SDOH Screenings   Food Insecurity: No Food Insecurity (05/02/2023)  Housing: Low Risk  (05/02/2023)  Transportation Needs: No Transportation Needs (05/02/2023)  Utilities: Not At Risk (05/02/2023)  Depression (PHQ2-9): Low Risk  (02/14/2023)  Financial Resource Strain: Low Risk  (04/27/2023)   Received from [facility]  Tobacco Use: Medium Risk (05/02/2023)   SDOH Interventions:     Readmission Risk Interventions     No data to display

## 2023-05-03 NOTE — Plan of Care (Signed)
  Problem: Activity: Goal: Ability to avoid complications of mobility impairment will improve Outcome: Progressing   Problem: Pain Management: Goal: Pain level will decrease with appropriate interventions Outcome: Progressing   Problem: Activity: Goal: Risk for activity intolerance will decrease Outcome: Progressing   

## 2023-05-04 DIAGNOSIS — Z96651 Presence of right artificial knee joint: Secondary | ICD-10-CM | POA: Diagnosis not present

## 2023-05-04 DIAGNOSIS — Z471 Aftercare following joint replacement surgery: Secondary | ICD-10-CM | POA: Diagnosis not present

## 2023-05-04 DIAGNOSIS — Z791 Long term (current) use of non-steroidal anti-inflammatories (NSAID): Secondary | ICD-10-CM | POA: Diagnosis not present

## 2023-05-04 DIAGNOSIS — Z7982 Long term (current) use of aspirin: Secondary | ICD-10-CM | POA: Diagnosis not present

## 2023-05-04 DIAGNOSIS — M1712 Unilateral primary osteoarthritis, left knee: Secondary | ICD-10-CM | POA: Diagnosis not present

## 2023-05-04 DIAGNOSIS — I10 Essential (primary) hypertension: Secondary | ICD-10-CM | POA: Diagnosis not present

## 2023-05-04 DIAGNOSIS — J302 Other seasonal allergic rhinitis: Secondary | ICD-10-CM | POA: Diagnosis not present

## 2023-05-04 NOTE — Anesthesia Postprocedure Evaluation (Signed)
 Anesthesia Post Note  Patient: Joanna Huffman  Procedure(s) Performed: ARTHROPLASTY, KNEE, TOTAL, USING IMAGELESS COMPUTER-ASSISTED NAVIGATION (Right: Knee)  Patient location during evaluation: Nursing Unit Anesthesia Type: Spinal Level of consciousness: oriented and awake and alert Pain management: pain level controlled Vital Signs Assessment: post-procedure vital signs reviewed and stable Respiratory status: spontaneous breathing and respiratory function stable Cardiovascular status: blood pressure returned to baseline and stable Postop Assessment: no headache, no backache, no apparent nausea or vomiting and patient able to bend at knees Anesthetic complications: no Comments: Patient with residual "pins/needles" sensation on R Leg.  Motor function intact, patient able to ambulate.  Dr. Welton Hall assessed.  Patient appropriate for discharge from anesthesia standpoint, instructed to follow up with Dr. Dillard Frame office if symptoms do not resolve in 48 hours.    There were no known notable events for this encounter.   Last Vitals:  Vitals:   05/03/23 0804 05/03/23 1250  BP: (!) 154/84 126/76  Pulse: 70 74  Resp: 15 15  Temp: 36.7 C 36.6 C  SpO2: 100% 98%    Last Pain:  Vitals:   05/03/23 1250  TempSrc: Oral  PainSc:                  Orin Birk

## 2023-05-06 DIAGNOSIS — M1712 Unilateral primary osteoarthritis, left knee: Secondary | ICD-10-CM | POA: Diagnosis not present

## 2023-05-06 DIAGNOSIS — J302 Other seasonal allergic rhinitis: Secondary | ICD-10-CM | POA: Diagnosis not present

## 2023-05-06 DIAGNOSIS — Z471 Aftercare following joint replacement surgery: Secondary | ICD-10-CM | POA: Diagnosis not present

## 2023-05-06 DIAGNOSIS — Z791 Long term (current) use of non-steroidal anti-inflammatories (NSAID): Secondary | ICD-10-CM | POA: Diagnosis not present

## 2023-05-06 DIAGNOSIS — I10 Essential (primary) hypertension: Secondary | ICD-10-CM | POA: Diagnosis not present

## 2023-05-06 DIAGNOSIS — Z96651 Presence of right artificial knee joint: Secondary | ICD-10-CM | POA: Diagnosis not present

## 2023-05-06 DIAGNOSIS — Z7982 Long term (current) use of aspirin: Secondary | ICD-10-CM | POA: Diagnosis not present

## 2023-05-07 DIAGNOSIS — Z791 Long term (current) use of non-steroidal anti-inflammatories (NSAID): Secondary | ICD-10-CM | POA: Diagnosis not present

## 2023-05-07 DIAGNOSIS — Z96651 Presence of right artificial knee joint: Secondary | ICD-10-CM | POA: Diagnosis not present

## 2023-05-07 DIAGNOSIS — I10 Essential (primary) hypertension: Secondary | ICD-10-CM | POA: Diagnosis not present

## 2023-05-07 DIAGNOSIS — M1712 Unilateral primary osteoarthritis, left knee: Secondary | ICD-10-CM | POA: Diagnosis not present

## 2023-05-07 DIAGNOSIS — Z471 Aftercare following joint replacement surgery: Secondary | ICD-10-CM | POA: Diagnosis not present

## 2023-05-07 DIAGNOSIS — Z7982 Long term (current) use of aspirin: Secondary | ICD-10-CM | POA: Diagnosis not present

## 2023-05-07 DIAGNOSIS — J302 Other seasonal allergic rhinitis: Secondary | ICD-10-CM | POA: Diagnosis not present

## 2023-05-09 DIAGNOSIS — Z96651 Presence of right artificial knee joint: Secondary | ICD-10-CM | POA: Diagnosis not present

## 2023-05-09 DIAGNOSIS — Z7982 Long term (current) use of aspirin: Secondary | ICD-10-CM | POA: Diagnosis not present

## 2023-05-09 DIAGNOSIS — I10 Essential (primary) hypertension: Secondary | ICD-10-CM | POA: Diagnosis not present

## 2023-05-09 DIAGNOSIS — M1712 Unilateral primary osteoarthritis, left knee: Secondary | ICD-10-CM | POA: Diagnosis not present

## 2023-05-09 DIAGNOSIS — J302 Other seasonal allergic rhinitis: Secondary | ICD-10-CM | POA: Diagnosis not present

## 2023-05-09 DIAGNOSIS — Z791 Long term (current) use of non-steroidal anti-inflammatories (NSAID): Secondary | ICD-10-CM | POA: Diagnosis not present

## 2023-05-09 DIAGNOSIS — Z471 Aftercare following joint replacement surgery: Secondary | ICD-10-CM | POA: Diagnosis not present

## 2023-05-11 DIAGNOSIS — Z96651 Presence of right artificial knee joint: Secondary | ICD-10-CM | POA: Diagnosis not present

## 2023-05-11 DIAGNOSIS — M1712 Unilateral primary osteoarthritis, left knee: Secondary | ICD-10-CM | POA: Diagnosis not present

## 2023-05-11 DIAGNOSIS — J302 Other seasonal allergic rhinitis: Secondary | ICD-10-CM | POA: Diagnosis not present

## 2023-05-11 DIAGNOSIS — Z471 Aftercare following joint replacement surgery: Secondary | ICD-10-CM | POA: Diagnosis not present

## 2023-05-11 DIAGNOSIS — Z7982 Long term (current) use of aspirin: Secondary | ICD-10-CM | POA: Diagnosis not present

## 2023-05-11 DIAGNOSIS — Z791 Long term (current) use of non-steroidal anti-inflammatories (NSAID): Secondary | ICD-10-CM | POA: Diagnosis not present

## 2023-05-11 DIAGNOSIS — I10 Essential (primary) hypertension: Secondary | ICD-10-CM | POA: Diagnosis not present

## 2023-05-13 DIAGNOSIS — Z471 Aftercare following joint replacement surgery: Secondary | ICD-10-CM | POA: Diagnosis not present

## 2023-05-13 DIAGNOSIS — Z96651 Presence of right artificial knee joint: Secondary | ICD-10-CM | POA: Diagnosis not present

## 2023-05-13 DIAGNOSIS — I10 Essential (primary) hypertension: Secondary | ICD-10-CM | POA: Diagnosis not present

## 2023-05-13 DIAGNOSIS — M1712 Unilateral primary osteoarthritis, left knee: Secondary | ICD-10-CM | POA: Diagnosis not present

## 2023-05-13 DIAGNOSIS — Z791 Long term (current) use of non-steroidal anti-inflammatories (NSAID): Secondary | ICD-10-CM | POA: Diagnosis not present

## 2023-05-13 DIAGNOSIS — Z7982 Long term (current) use of aspirin: Secondary | ICD-10-CM | POA: Diagnosis not present

## 2023-05-13 DIAGNOSIS — J302 Other seasonal allergic rhinitis: Secondary | ICD-10-CM | POA: Diagnosis not present

## 2023-05-16 DIAGNOSIS — I10 Essential (primary) hypertension: Secondary | ICD-10-CM | POA: Diagnosis not present

## 2023-05-16 DIAGNOSIS — Z471 Aftercare following joint replacement surgery: Secondary | ICD-10-CM | POA: Diagnosis not present

## 2023-05-16 DIAGNOSIS — Z791 Long term (current) use of non-steroidal anti-inflammatories (NSAID): Secondary | ICD-10-CM | POA: Diagnosis not present

## 2023-05-16 DIAGNOSIS — Z96651 Presence of right artificial knee joint: Secondary | ICD-10-CM | POA: Diagnosis not present

## 2023-05-16 DIAGNOSIS — M1712 Unilateral primary osteoarthritis, left knee: Secondary | ICD-10-CM | POA: Diagnosis not present

## 2023-05-16 DIAGNOSIS — J302 Other seasonal allergic rhinitis: Secondary | ICD-10-CM | POA: Diagnosis not present

## 2023-05-16 DIAGNOSIS — Z7982 Long term (current) use of aspirin: Secondary | ICD-10-CM | POA: Diagnosis not present

## 2023-05-17 DIAGNOSIS — Z96651 Presence of right artificial knee joint: Secondary | ICD-10-CM | POA: Diagnosis not present

## 2023-05-19 DIAGNOSIS — M25661 Stiffness of right knee, not elsewhere classified: Secondary | ICD-10-CM | POA: Diagnosis not present

## 2023-05-19 DIAGNOSIS — M25561 Pain in right knee: Secondary | ICD-10-CM | POA: Diagnosis not present

## 2023-05-19 DIAGNOSIS — Z96651 Presence of right artificial knee joint: Secondary | ICD-10-CM | POA: Diagnosis not present

## 2023-05-19 DIAGNOSIS — M25461 Effusion, right knee: Secondary | ICD-10-CM | POA: Diagnosis not present

## 2023-05-19 DIAGNOSIS — R29898 Other symptoms and signs involving the musculoskeletal system: Secondary | ICD-10-CM | POA: Diagnosis not present

## 2023-05-19 DIAGNOSIS — R262 Difficulty in walking, not elsewhere classified: Secondary | ICD-10-CM | POA: Diagnosis not present

## 2023-05-24 DIAGNOSIS — M25661 Stiffness of right knee, not elsewhere classified: Secondary | ICD-10-CM | POA: Diagnosis not present

## 2023-05-24 DIAGNOSIS — M25561 Pain in right knee: Secondary | ICD-10-CM | POA: Diagnosis not present

## 2023-05-24 DIAGNOSIS — R29898 Other symptoms and signs involving the musculoskeletal system: Secondary | ICD-10-CM | POA: Diagnosis not present

## 2023-05-24 DIAGNOSIS — R262 Difficulty in walking, not elsewhere classified: Secondary | ICD-10-CM | POA: Diagnosis not present

## 2023-05-24 DIAGNOSIS — Z96651 Presence of right artificial knee joint: Secondary | ICD-10-CM | POA: Diagnosis not present

## 2023-05-24 DIAGNOSIS — M25461 Effusion, right knee: Secondary | ICD-10-CM | POA: Diagnosis not present

## 2023-05-26 DIAGNOSIS — M25461 Effusion, right knee: Secondary | ICD-10-CM | POA: Diagnosis not present

## 2023-05-26 DIAGNOSIS — M25661 Stiffness of right knee, not elsewhere classified: Secondary | ICD-10-CM | POA: Diagnosis not present

## 2023-05-26 DIAGNOSIS — R29898 Other symptoms and signs involving the musculoskeletal system: Secondary | ICD-10-CM | POA: Diagnosis not present

## 2023-05-26 DIAGNOSIS — R262 Difficulty in walking, not elsewhere classified: Secondary | ICD-10-CM | POA: Diagnosis not present

## 2023-05-26 DIAGNOSIS — M25561 Pain in right knee: Secondary | ICD-10-CM | POA: Diagnosis not present

## 2023-05-26 DIAGNOSIS — Z96651 Presence of right artificial knee joint: Secondary | ICD-10-CM | POA: Diagnosis not present

## 2023-05-30 DIAGNOSIS — M25461 Effusion, right knee: Secondary | ICD-10-CM | POA: Diagnosis not present

## 2023-05-30 DIAGNOSIS — Z96651 Presence of right artificial knee joint: Secondary | ICD-10-CM | POA: Diagnosis not present

## 2023-05-30 DIAGNOSIS — R262 Difficulty in walking, not elsewhere classified: Secondary | ICD-10-CM | POA: Diagnosis not present

## 2023-05-30 DIAGNOSIS — M25561 Pain in right knee: Secondary | ICD-10-CM | POA: Diagnosis not present

## 2023-05-30 DIAGNOSIS — M25661 Stiffness of right knee, not elsewhere classified: Secondary | ICD-10-CM | POA: Diagnosis not present

## 2023-05-30 DIAGNOSIS — R29898 Other symptoms and signs involving the musculoskeletal system: Secondary | ICD-10-CM | POA: Diagnosis not present

## 2023-06-03 DIAGNOSIS — R29898 Other symptoms and signs involving the musculoskeletal system: Secondary | ICD-10-CM | POA: Diagnosis not present

## 2023-06-03 DIAGNOSIS — M25661 Stiffness of right knee, not elsewhere classified: Secondary | ICD-10-CM | POA: Diagnosis not present

## 2023-06-03 DIAGNOSIS — M25561 Pain in right knee: Secondary | ICD-10-CM | POA: Diagnosis not present

## 2023-06-03 DIAGNOSIS — Z96651 Presence of right artificial knee joint: Secondary | ICD-10-CM | POA: Diagnosis not present

## 2023-06-03 DIAGNOSIS — M25461 Effusion, right knee: Secondary | ICD-10-CM | POA: Diagnosis not present

## 2023-06-03 DIAGNOSIS — R262 Difficulty in walking, not elsewhere classified: Secondary | ICD-10-CM | POA: Diagnosis not present

## 2023-06-14 DIAGNOSIS — M25661 Stiffness of right knee, not elsewhere classified: Secondary | ICD-10-CM | POA: Diagnosis not present

## 2023-06-14 DIAGNOSIS — M25561 Pain in right knee: Secondary | ICD-10-CM | POA: Diagnosis not present

## 2023-06-14 DIAGNOSIS — M25461 Effusion, right knee: Secondary | ICD-10-CM | POA: Diagnosis not present

## 2023-06-14 DIAGNOSIS — R262 Difficulty in walking, not elsewhere classified: Secondary | ICD-10-CM | POA: Diagnosis not present

## 2023-06-14 DIAGNOSIS — Z96651 Presence of right artificial knee joint: Secondary | ICD-10-CM | POA: Diagnosis not present

## 2023-06-14 DIAGNOSIS — R29898 Other symptoms and signs involving the musculoskeletal system: Secondary | ICD-10-CM | POA: Diagnosis not present

## 2023-06-15 DIAGNOSIS — D225 Melanocytic nevi of trunk: Secondary | ICD-10-CM | POA: Diagnosis not present

## 2023-06-15 DIAGNOSIS — D2262 Melanocytic nevi of left upper limb, including shoulder: Secondary | ICD-10-CM | POA: Diagnosis not present

## 2023-06-15 DIAGNOSIS — D2371 Other benign neoplasm of skin of right lower limb, including hip: Secondary | ICD-10-CM | POA: Diagnosis not present

## 2023-06-15 DIAGNOSIS — L819 Disorder of pigmentation, unspecified: Secondary | ICD-10-CM | POA: Diagnosis not present

## 2023-06-15 DIAGNOSIS — W891XXA Exposure to tanning bed, initial encounter: Secondary | ICD-10-CM | POA: Diagnosis not present

## 2023-06-15 DIAGNOSIS — R208 Other disturbances of skin sensation: Secondary | ICD-10-CM | POA: Diagnosis not present

## 2023-06-15 DIAGNOSIS — D2272 Melanocytic nevi of left lower limb, including hip: Secondary | ICD-10-CM | POA: Diagnosis not present

## 2023-06-15 DIAGNOSIS — D485 Neoplasm of uncertain behavior of skin: Secondary | ICD-10-CM | POA: Diagnosis not present

## 2023-06-15 DIAGNOSIS — D2271 Melanocytic nevi of right lower limb, including hip: Secondary | ICD-10-CM | POA: Diagnosis not present

## 2023-06-15 DIAGNOSIS — D2261 Melanocytic nevi of right upper limb, including shoulder: Secondary | ICD-10-CM | POA: Diagnosis not present

## 2023-06-15 DIAGNOSIS — C44719 Basal cell carcinoma of skin of left lower limb, including hip: Secondary | ICD-10-CM | POA: Diagnosis not present

## 2023-07-28 DIAGNOSIS — M51369 Other intervertebral disc degeneration, lumbar region without mention of lumbar back pain or lower extremity pain: Secondary | ICD-10-CM | POA: Diagnosis not present

## 2023-07-28 DIAGNOSIS — Z1331 Encounter for screening for depression: Secondary | ICD-10-CM | POA: Diagnosis not present

## 2023-07-28 DIAGNOSIS — M170 Bilateral primary osteoarthritis of knee: Secondary | ICD-10-CM | POA: Diagnosis not present

## 2023-07-28 DIAGNOSIS — M19041 Primary osteoarthritis, right hand: Secondary | ICD-10-CM | POA: Diagnosis not present

## 2023-07-28 DIAGNOSIS — M19042 Primary osteoarthritis, left hand: Secondary | ICD-10-CM | POA: Diagnosis not present

## 2023-07-28 DIAGNOSIS — F320 Major depressive disorder, single episode, mild: Secondary | ICD-10-CM | POA: Diagnosis not present

## 2023-07-28 DIAGNOSIS — I10 Essential (primary) hypertension: Secondary | ICD-10-CM | POA: Diagnosis not present

## 2023-08-22 DIAGNOSIS — L578 Other skin changes due to chronic exposure to nonionizing radiation: Secondary | ICD-10-CM | POA: Diagnosis not present

## 2023-08-22 DIAGNOSIS — L905 Scar conditions and fibrosis of skin: Secondary | ICD-10-CM | POA: Diagnosis not present

## 2023-08-22 DIAGNOSIS — D2271 Melanocytic nevi of right lower limb, including hip: Secondary | ICD-10-CM | POA: Diagnosis not present

## 2023-09-05 DIAGNOSIS — C44719 Basal cell carcinoma of skin of left lower limb, including hip: Secondary | ICD-10-CM | POA: Diagnosis not present

## 2023-11-01 DIAGNOSIS — M7631 Iliotibial band syndrome, right leg: Secondary | ICD-10-CM | POA: Diagnosis not present

## 2023-11-01 DIAGNOSIS — Z96651 Presence of right artificial knee joint: Secondary | ICD-10-CM | POA: Diagnosis not present
